# Patient Record
Sex: Male | Born: 1985 | Race: White | Hispanic: No | Marital: Married | Smoking: Current every day smoker
Health system: Southern US, Community
[De-identification: ages and names within clinical notes are randomized; demographics above are authoritative.]

## PROBLEM LIST (undated history)

## (undated) ENCOUNTER — Emergency Department: Payer: Medicaid Other

## (undated) DIAGNOSIS — N200 Calculus of kidney: Secondary | ICD-10-CM

## (undated) DIAGNOSIS — A770 Spotted fever due to Rickettsia rickettsii: Secondary | ICD-10-CM

## (undated) DIAGNOSIS — A0472 Enterocolitis due to Clostridium difficile, not specified as recurrent: Secondary | ICD-10-CM

## (undated) DIAGNOSIS — B1920 Unspecified viral hepatitis C without hepatic coma: Secondary | ICD-10-CM

## (undated) HISTORY — PX: TONSILLECTOMY: SUR1361

---

## 2005-04-19 ENCOUNTER — Emergency Department: Payer: Self-pay | Admitting: Unknown Physician Specialty

## 2008-02-26 ENCOUNTER — Ambulatory Visit: Payer: Self-pay | Admitting: Family Medicine

## 2008-02-26 ENCOUNTER — Emergency Department: Payer: Self-pay | Admitting: Emergency Medicine

## 2008-02-26 IMAGING — CR DG CHEST 2V
1 series · 2 of 2 positions shown · non-contrast
Comparison: none

REASON FOR EXAM: fever
COMMENTS:

[Series 1: view not recorded · 0.17mm/px · 2 of 2 slices shown]
[im 1/2]
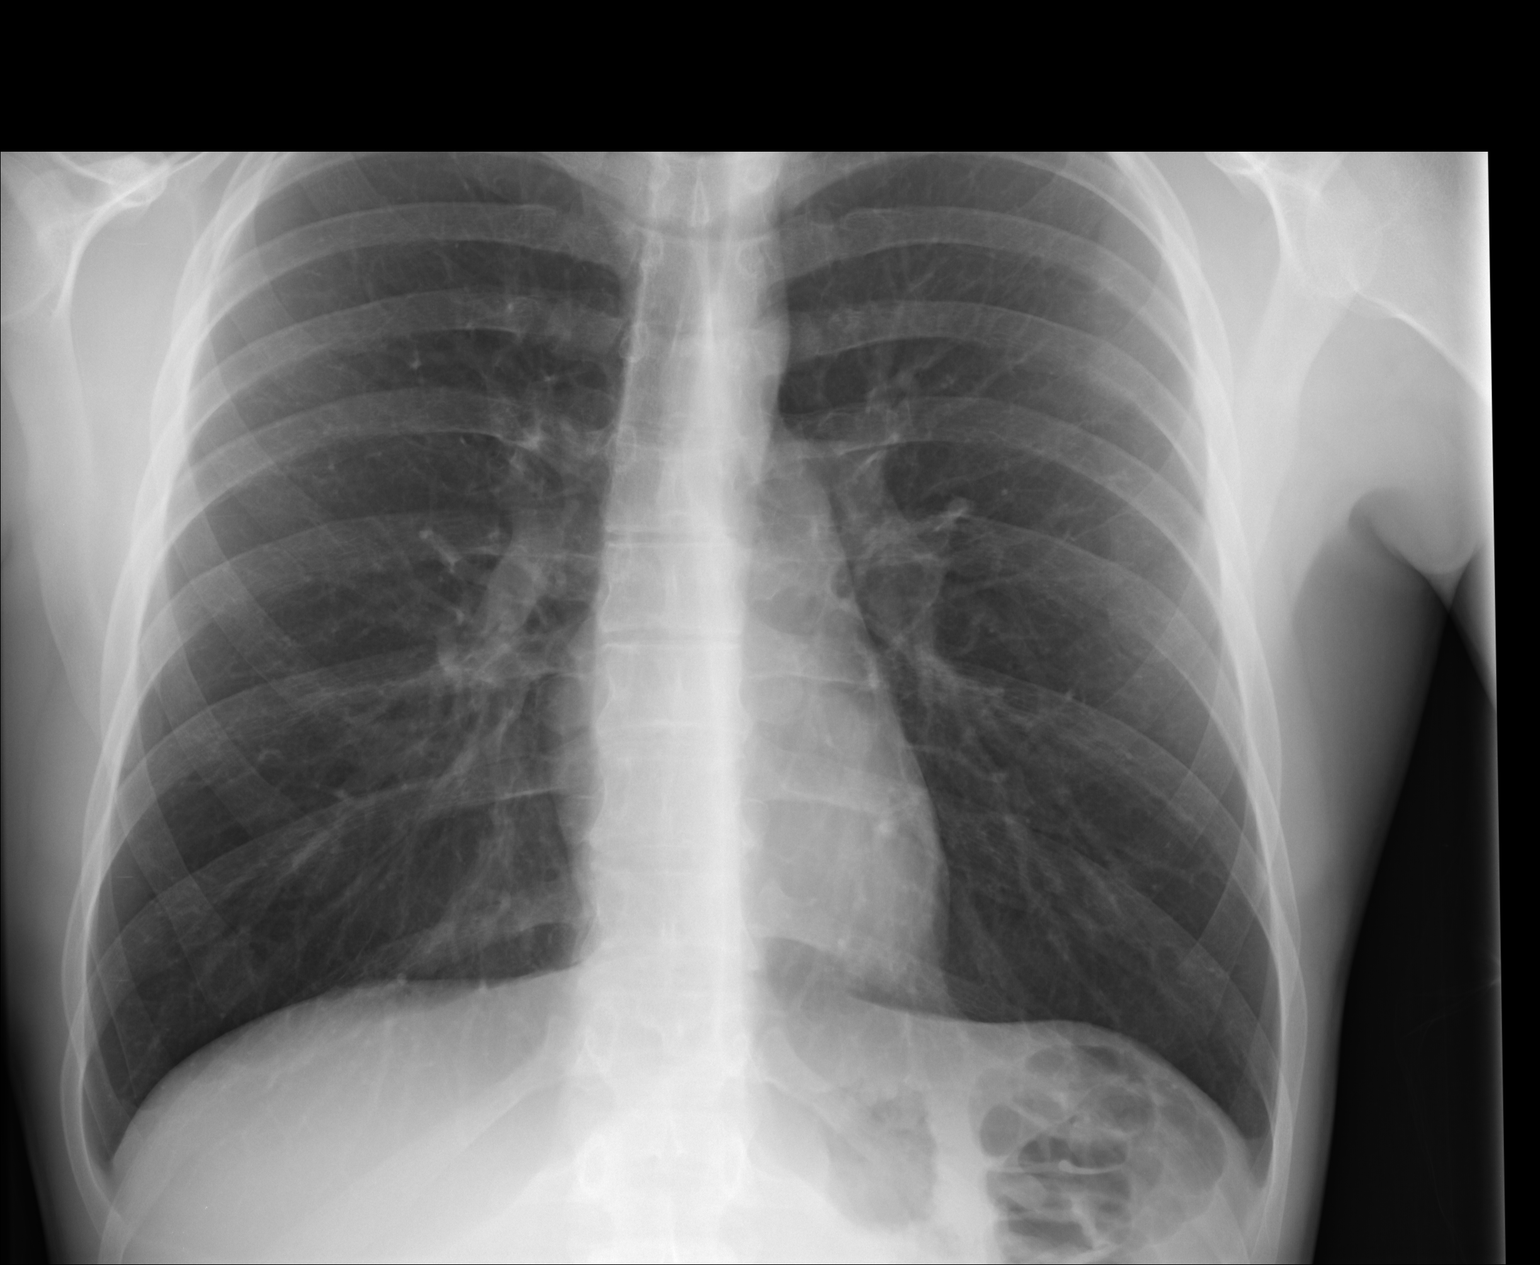
[im 2/2]
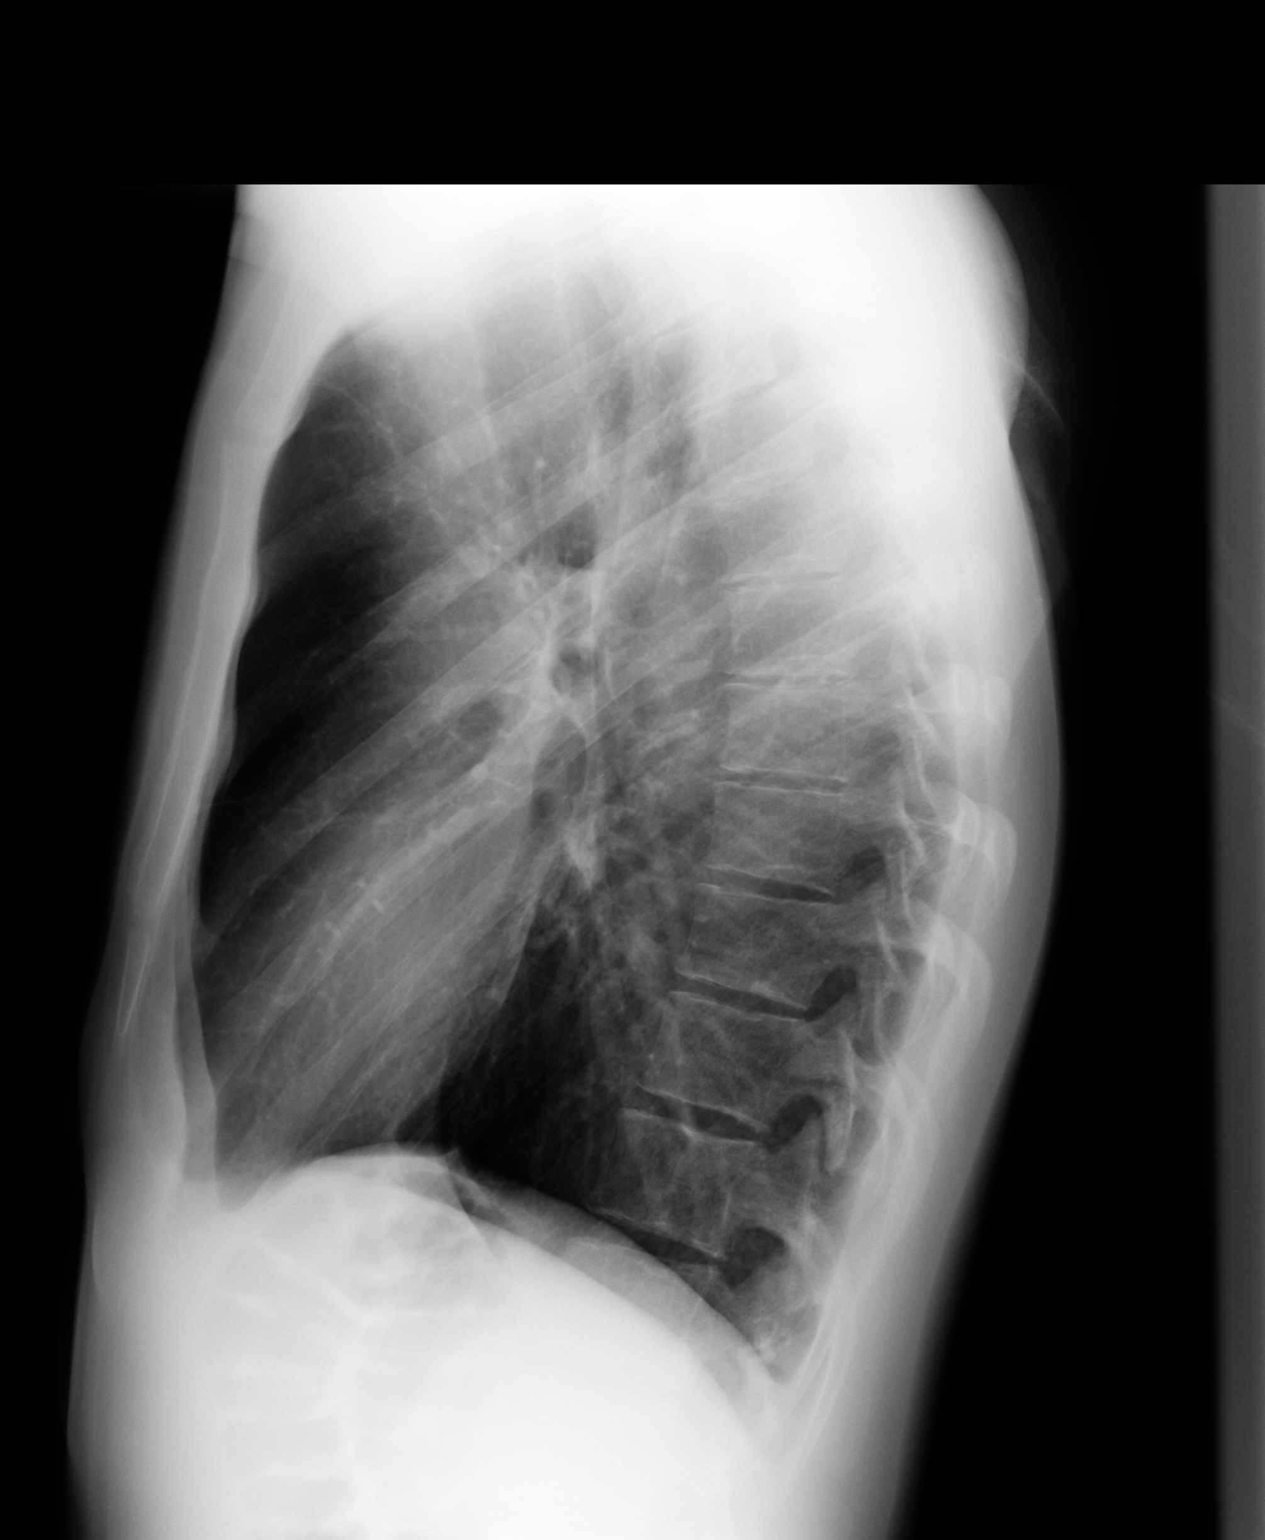

[2 of 2 positions shown; findings below may reference images not displayed]

PROCEDURE:     DXR - DXR CHEST PA (OR AP) AND LATERAL  - February 27, 2008 [DATE]

RESULT:     The lung fields are clear.  No pneumonia, pneumothorax or
pleural effusion is seen. Heart size is normal. The chest appears mildly
hyperexpanded.  The mediastinal and osseous structures are normal in
appearance.
IMPRESSION: 1.  The lung fields are clear.
2.  The chest appears mildly hyperexpanded.

## 2010-05-09 ENCOUNTER — Emergency Department (HOSPITAL_COMMUNITY): Admission: EM | Admit: 2010-05-09 | Discharge: 2010-05-09 | Payer: Self-pay | Admitting: Internal Medicine

## 2010-05-09 IMAGING — CT CT ABD-PELV W/O CM
2 of 4 series · 17 of 46 positions shown, 19 images · non-contrast
Comparison: None

CLINICAL DATA: Right-sided flank pain.  Hematuria.  History of
right renal stone.

CT ABDOMEN AND PELVIS WITHOUT CONTRAST
TECHNIQUE: Multidetector CT imaging of the abdomen and pelvis was
performed following the standard protocol without intravenous
contrast.

[Series 2: stone <(id) >(id) · axial · 0.70mm/px · z∈[-445,-65]mm · 14 of 84 slices shown, 16 images]
[im 4/84  soft-tissue]
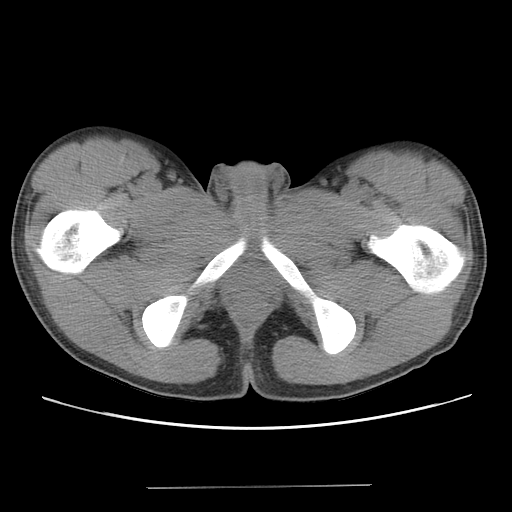
[im 4/84  bone]
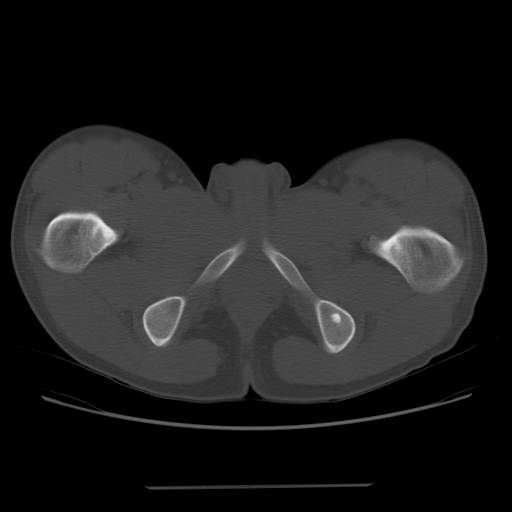
[im 12/84  soft-tissue]
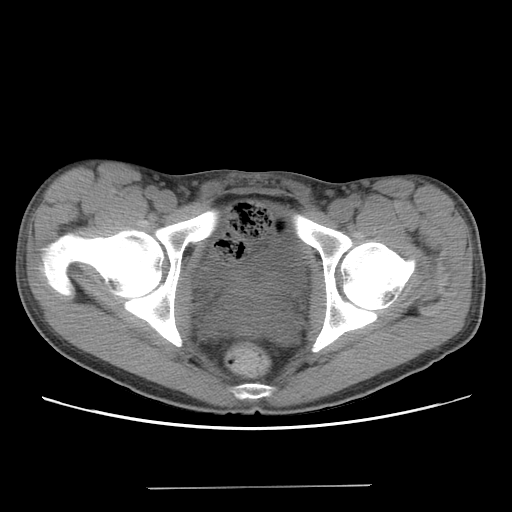
[im 16/84  soft-tissue]
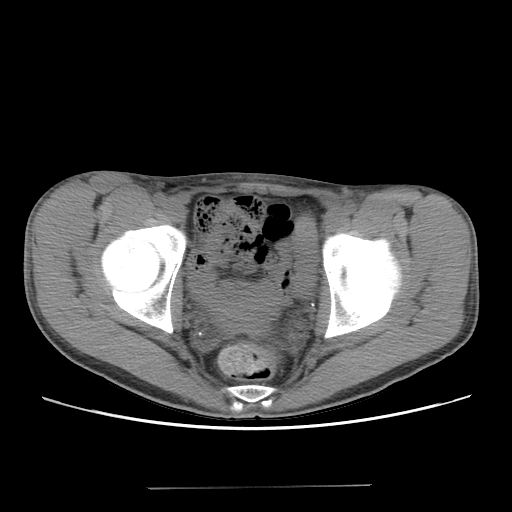
[im 23/84  soft-tissue]
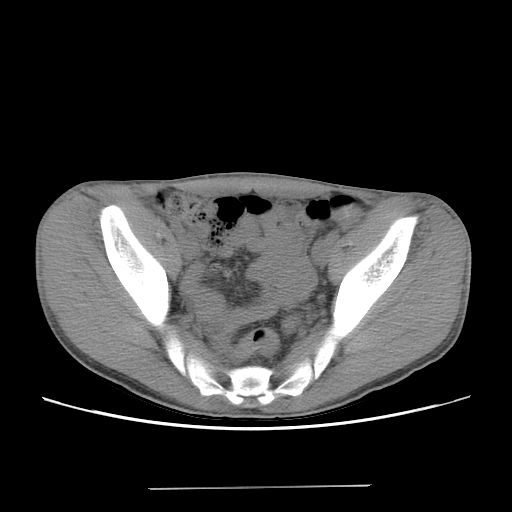
[im 27/84  soft-tissue]
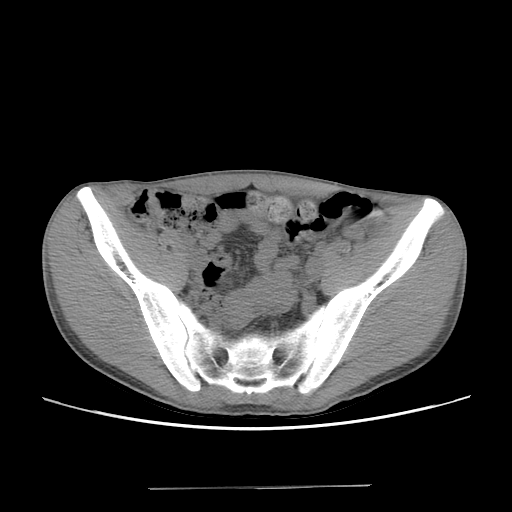
[im 34/84  soft-tissue]
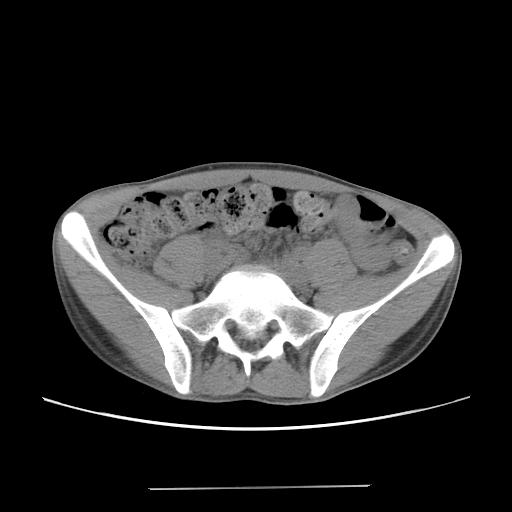
[im 38/84  soft-tissue]
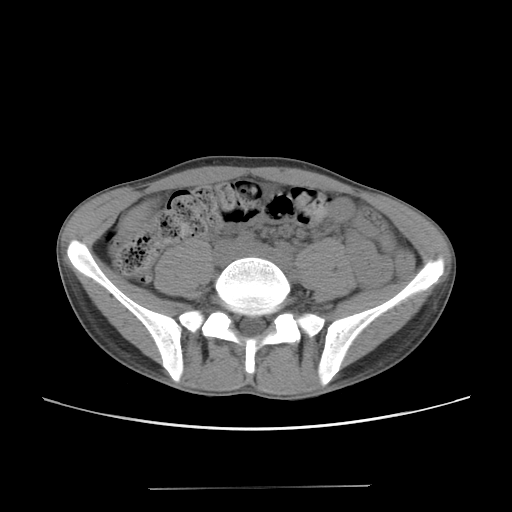
[im 46/84  soft-tissue]
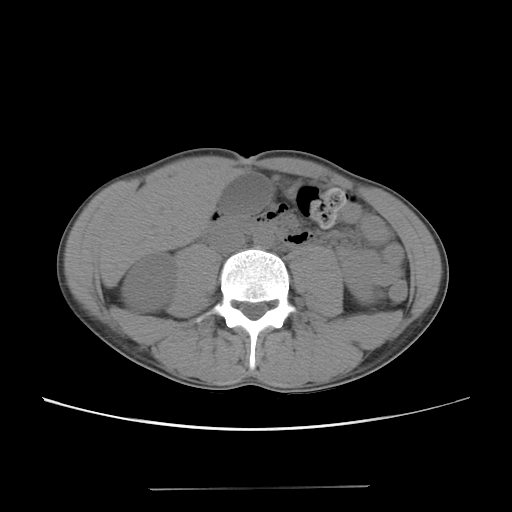
[im 50/84  soft-tissue]
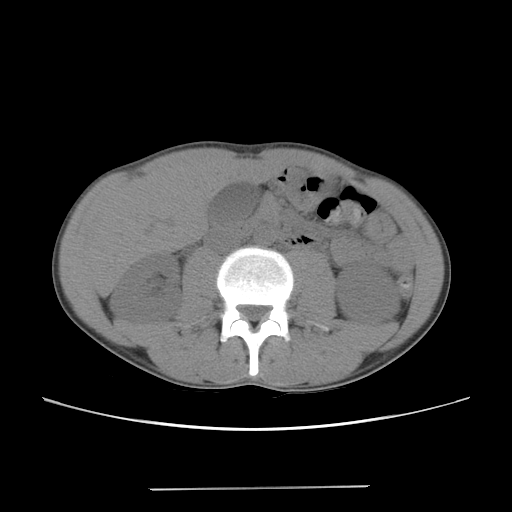
[im 50/84  bone]
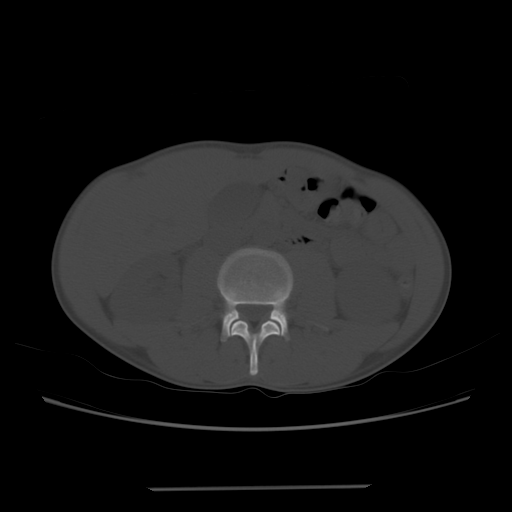
[im 57/84  soft-tissue]
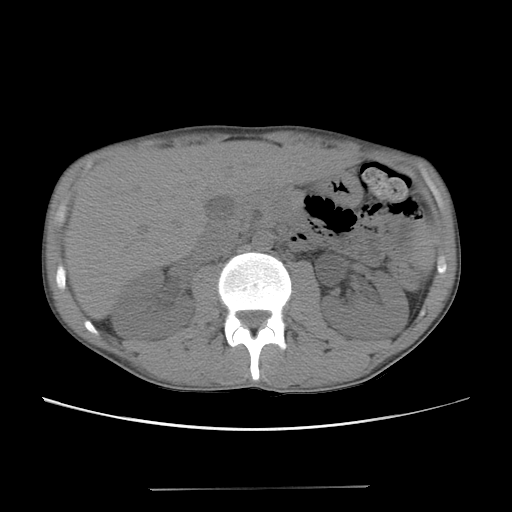
[im 61/84  soft-tissue]
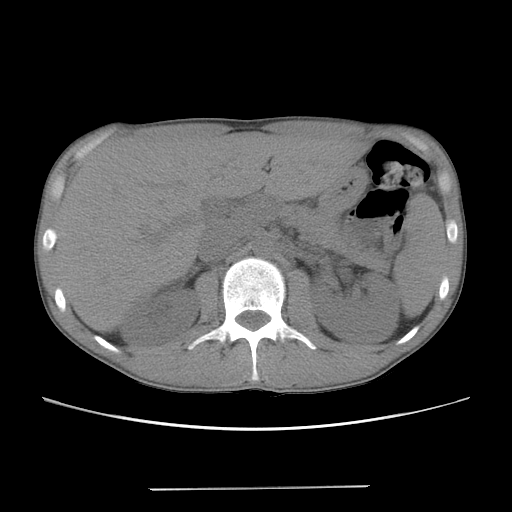
[im 68/84  soft-tissue]
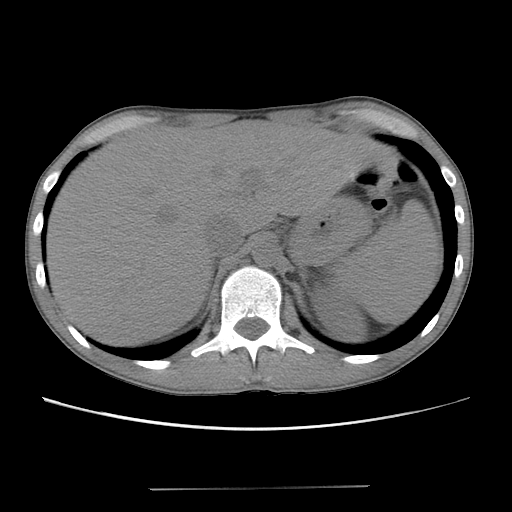
[im 72/84  soft-tissue]
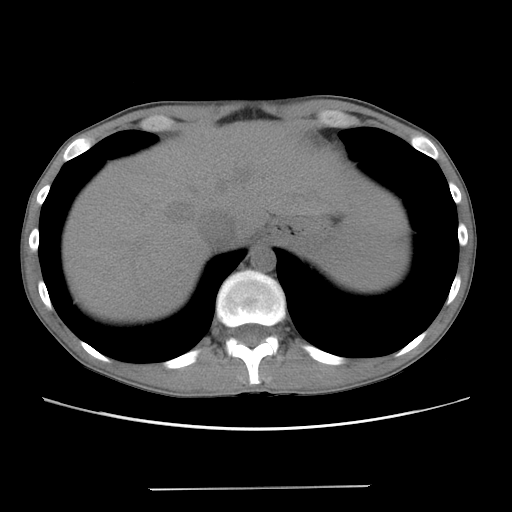
[im 80/84  soft-tissue]
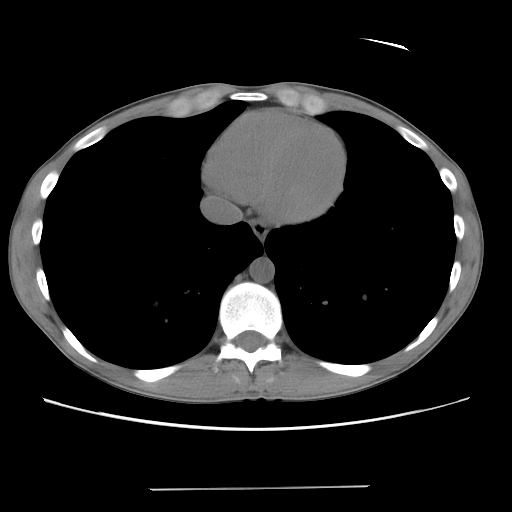

[Series 401: coronal · coronal · 0.87mm/px · 3 of 75 slices shown]
[im 25/75  soft-tissue]
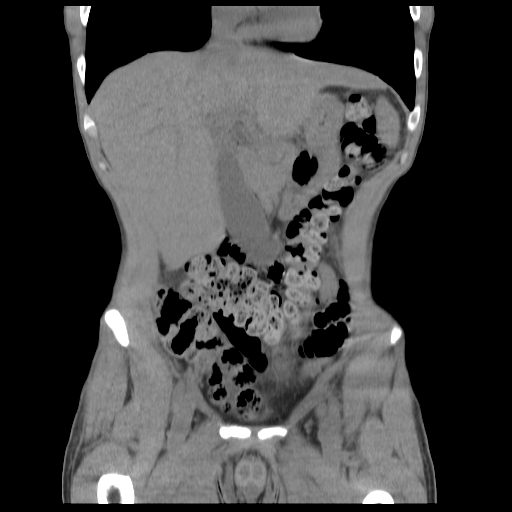
[im 33/75  soft-tissue]
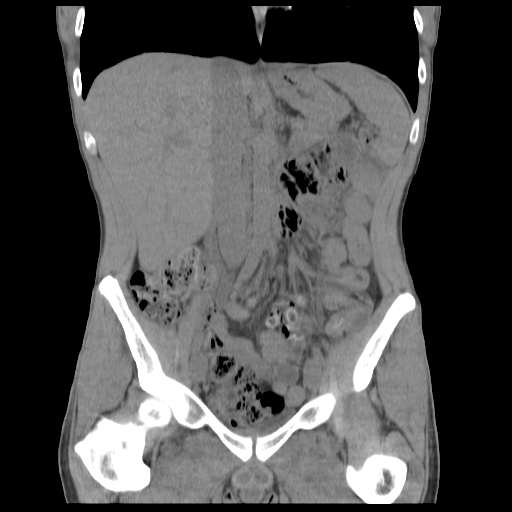
[im 42/75  soft-tissue]
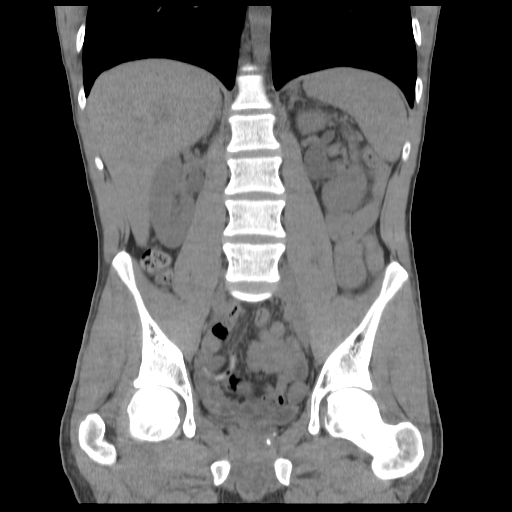

[17 of 46 positions shown; findings below may reference images not displayed]

FINDINGS: Lung bases are clear.  No pleural or pericardial fluid.
The liver has a normal appearance without contrast.  No calcified
gallstones.  The spleen is normal.  The pancreas is normal.  The
adrenal glands are normal.  The left kidney contains a 2 mm stone
in the midportion.  There is an extrarenal pelvis but no evidence
of dilated ureter or passing stone on the left.  There is some
phleboliths in the left pelvis.

On the right, there are a few 1-2 mm nonobstructing stones within
the kidney.  There is mild fullness of the renal collecting system
and ureter.  There is a 2 mm stone that has apparently passed into
the bladder and is lying in the midline.  Prostate gland and
seminal vesicles appear unremarkable.  No free fluid in the pelvis.

No bowel pathology is seen.
IMPRESSION: Several 1-2 mm nonobstructing calculi in the kidneys.  Mild
fullness of the right renal collecting system but no evidence of
stone in the ureter.  2 mm stone dependent within the bladder,
presumably having passed through the right ureteral system.

## 2010-05-12 ENCOUNTER — Emergency Department (HOSPITAL_COMMUNITY): Admission: EM | Admit: 2010-05-12 | Discharge: 2010-05-12 | Payer: Self-pay | Admitting: Emergency Medicine

## 2011-01-06 LAB — URINALYSIS, ROUTINE W REFLEX MICROSCOPIC
Protein, ur: 30 mg/dL — AB
Urobilinogen, UA: 1 mg/dL (ref 0.0–1.0)

## 2011-01-06 LAB — DIFFERENTIAL
Basophils Absolute: 0 10*3/uL (ref 0.0–0.1)
Basophils Relative: 0 % (ref 0–1)
Eosinophils Absolute: 0.2 10*3/uL (ref 0.0–0.7)
Eosinophils Relative: 2 % (ref 0–5)
Lymphocytes Relative: 25 % (ref 12–46)
Lymphs Abs: 2 10*3/uL (ref 0.7–4.0)
Monocytes Absolute: 0.6 10*3/uL (ref 0.1–1.0)
Neutro Abs: 5.2 10*3/uL (ref 1.7–7.7)
Neutrophils Relative %: 65 % (ref 43–77)

## 2011-01-06 LAB — CBC
Platelets: 224 10*3/uL (ref 150–400)
RBC: 4.19 MIL/uL — ABNORMAL LOW (ref 4.22–5.81)
RDW: 13.3 % (ref 11.5–15.5)

## 2011-01-06 LAB — URINE MICROSCOPIC-ADD ON

## 2011-01-06 LAB — URINE CULTURE: Colony Count: NO GROWTH

## 2011-01-06 LAB — POCT I-STAT, CHEM 8
BUN: 10 mg/dL (ref 6–23)
Hemoglobin: 13.3 g/dL (ref 13.0–17.0)
TCO2: 28 mmol/L (ref 0–100)

## 2011-05-30 ENCOUNTER — Emergency Department: Payer: Self-pay | Admitting: Emergency Medicine

## 2012-07-29 ENCOUNTER — Emergency Department: Payer: Self-pay | Admitting: Emergency Medicine

## 2012-07-29 LAB — ETHANOL: Ethanol: <3 mg/dL

## 2012-07-29 LAB — DRUG SCREEN, URINE
Amphetamines, Ur Screen: NEGATIVE
Barbiturates, Ur Screen: NEGATIVE
Benzodiazepine, Ur Scrn: POSITIVE
Cannabinoid 50 Ng, Ur ~~LOC~~: POSITIVE
Cocaine Metabolite,Ur ~~LOC~~: NEGATIVE
MDMA (Ecstasy)Ur Screen: NEGATIVE
Methadone, Ur Screen: NEGATIVE
Opiate, Ur Screen: POSITIVE
Phencyclidine (PCP) Ur S: NEGATIVE
Tricyclic, Ur Screen: NEGATIVE

## 2012-07-29 LAB — COMPREHENSIVE METABOLIC PANEL
Anion Gap: 6 — ABNORMAL LOW (ref 7–16)
BUN: 8 mg/dL (ref 7–18)
Bilirubin,Total: 0.3 mg/dL (ref 0.2–1.0)
Chloride: 106 mmol/L (ref 98–107)
Co2: 29 mmol/L (ref 21–32)
Creatinine: 0.87 mg/dL (ref 0.60–1.30)
EGFR (African American): >60
EGFR (Non-African Amer.): >60
Osmolality: 279 (ref 275–301)
Potassium: 4.5 mmol/L (ref 3.5–5.1)
SGPT (ALT): 16 U/L (ref 12–78)
Sodium: 141 mmol/L (ref 136–145)
Total Protein: 7.8 g/dL (ref 6.4–8.2)

## 2012-07-29 LAB — CBC
HGB: 14.6 g/dL (ref 13.0–18.0)
MCHC: 34.7 g/dL (ref 32.0–36.0)
RBC: 4.83 10*6/uL (ref 4.40–5.90)

## 2012-07-29 LAB — TSH: Thyroid Stimulating Horm: 2.21 u[IU]/mL

## 2012-12-19 ENCOUNTER — Emergency Department (HOSPITAL_COMMUNITY): Payer: Self-pay

## 2012-12-19 ENCOUNTER — Other Ambulatory Visit: Payer: Self-pay

## 2012-12-19 ENCOUNTER — Inpatient Hospital Stay (HOSPITAL_COMMUNITY)
Admission: EM | Admit: 2012-12-19 | Discharge: 2012-12-22 | DRG: 917 | Disposition: A | Discharge status: Home / Self Care | Payer: Self-pay | Attending: Internal Medicine | Admitting: Internal Medicine

## 2012-12-19 DIAGNOSIS — G929 Unspecified toxic encephalopathy: Secondary | ICD-10-CM | POA: Diagnosis present

## 2012-12-19 DIAGNOSIS — I498 Other specified cardiac arrhythmias: Secondary | ICD-10-CM | POA: Diagnosis present

## 2012-12-19 DIAGNOSIS — I959 Hypotension, unspecified: Secondary | ICD-10-CM | POA: Diagnosis present

## 2012-12-19 DIAGNOSIS — E872 Acidosis, unspecified: Secondary | ICD-10-CM | POA: Diagnosis present

## 2012-12-19 DIAGNOSIS — G92 Toxic encephalopathy: Secondary | ICD-10-CM | POA: Diagnosis present

## 2012-12-19 DIAGNOSIS — R7309 Other abnormal glucose: Secondary | ICD-10-CM | POA: Diagnosis present

## 2012-12-19 DIAGNOSIS — R945 Abnormal results of liver function studies: Secondary | ICD-10-CM | POA: Diagnosis present

## 2012-12-19 DIAGNOSIS — F1410 Cocaine abuse, uncomplicated: Secondary | ICD-10-CM | POA: Diagnosis present

## 2012-12-19 DIAGNOSIS — F1110 Opioid abuse, uncomplicated: Secondary | ICD-10-CM | POA: Diagnosis present

## 2012-12-19 DIAGNOSIS — F1310 Sedative, hypnotic or anxiolytic abuse, uncomplicated: Secondary | ICD-10-CM | POA: Diagnosis present

## 2012-12-19 DIAGNOSIS — J9600 Acute respiratory failure, unspecified whether with hypoxia or hypercapnia: Secondary | ICD-10-CM | POA: Diagnosis present

## 2012-12-19 DIAGNOSIS — T43601A Poisoning by unspecified psychostimulants, accidental (unintentional), initial encounter: Secondary | ICD-10-CM | POA: Diagnosis present

## 2012-12-19 DIAGNOSIS — T405X1A Poisoning by cocaine, accidental (unintentional), initial encounter: Secondary | ICD-10-CM | POA: Diagnosis present

## 2012-12-19 DIAGNOSIS — F1910 Other psychoactive substance abuse, uncomplicated: Secondary | ICD-10-CM

## 2012-12-19 DIAGNOSIS — T424X4A Poisoning by benzodiazepines, undetermined, initial encounter: Secondary | ICD-10-CM | POA: Diagnosis present

## 2012-12-19 DIAGNOSIS — J9601 Acute respiratory failure with hypoxia: Secondary | ICD-10-CM | POA: Diagnosis present

## 2012-12-19 DIAGNOSIS — J9690 Respiratory failure, unspecified, unspecified whether with hypoxia or hypercapnia: Secondary | ICD-10-CM

## 2012-12-19 DIAGNOSIS — T50901A Poisoning by unspecified drugs, medicaments and biological substances, accidental (unintentional), initial encounter: Secondary | ICD-10-CM

## 2012-12-19 DIAGNOSIS — J9819 Other pulmonary collapse: Secondary | ICD-10-CM | POA: Diagnosis present

## 2012-12-19 DIAGNOSIS — T401X4A Poisoning by heroin, undetermined, initial encounter: Principal | ICD-10-CM | POA: Diagnosis present

## 2012-12-19 DIAGNOSIS — J690 Pneumonitis due to inhalation of food and vomit: Secondary | ICD-10-CM | POA: Diagnosis present

## 2012-12-19 DIAGNOSIS — T424X1A Poisoning by benzodiazepines, accidental (unintentional), initial encounter: Secondary | ICD-10-CM | POA: Diagnosis present

## 2012-12-19 DIAGNOSIS — T50911A Poisoning by multiple unspecified drugs, medicaments and biological substances, accidental (unintentional), initial encounter: Secondary | ICD-10-CM

## 2012-12-19 DIAGNOSIS — T401X1A Poisoning by heroin, accidental (unintentional), initial encounter: Secondary | ICD-10-CM | POA: Diagnosis present

## 2012-12-19 DIAGNOSIS — R739 Hyperglycemia, unspecified: Secondary | ICD-10-CM

## 2012-12-19 DIAGNOSIS — F17200 Nicotine dependence, unspecified, uncomplicated: Secondary | ICD-10-CM | POA: Diagnosis present

## 2012-12-19 DIAGNOSIS — Y9229 Other specified public building as the place of occurrence of the external cause: Secondary | ICD-10-CM

## 2012-12-19 DIAGNOSIS — E876 Hypokalemia: Secondary | ICD-10-CM | POA: Diagnosis not present

## 2012-12-19 DIAGNOSIS — R4189 Other symptoms and signs involving cognitive functions and awareness: Secondary | ICD-10-CM

## 2012-12-19 LAB — CBC WITH DIFFERENTIAL/PLATELET
HCT: 39.3 % (ref 39.0–52.0)
Lymphocytes Relative: 59 % — ABNORMAL HIGH (ref 12–46)
Lymphs Abs: 3.8 10*3/uL (ref 0.7–4.0)
Neutro Abs: 2 10*3/uL (ref 1.7–7.7)
Platelets: 193 10*3/uL (ref 150–400)
RBC: 4.33 MIL/uL (ref 4.22–5.81)
RDW: 13.6 % (ref 11.5–15.5)
WBC: 6.4 10*3/uL (ref 4.0–10.5)

## 2012-12-19 LAB — URINALYSIS, ROUTINE W REFLEX MICROSCOPIC
Glucose, UA: >1000 mg/dL — AB
Leukocytes, UA: NEGATIVE
Nitrite: NEGATIVE
Specific Gravity, Urine: 1.02 (ref 1.005–1.030)
pH: 5 (ref 5.0–8.0)

## 2012-12-19 LAB — GLUCOSE, CAPILLARY: Glucose-Capillary: 314 mg/dL — ABNORMAL HIGH (ref 70–99)

## 2012-12-19 LAB — RAPID URINE DRUG SCREEN, HOSP PERFORMED
Amphetamines: NOT DETECTED
Benzodiazepines: POSITIVE — AB
Opiates: POSITIVE — AB

## 2012-12-19 LAB — POCT I-STAT 3, ART BLOOD GAS (G3+)
Acid-Base Excess: 1 mmol/L (ref 0.0–2.0)
Bicarbonate: 28.4 mEq/L — ABNORMAL HIGH (ref 20.0–24.0)
Patient temperature: 98.6
pH, Arterial: 7.295 — ABNORMAL LOW (ref 7.350–7.450)
pO2, Arterial: 321 mmHg — ABNORMAL HIGH (ref 80.0–100.0)

## 2012-12-19 LAB — COMPREHENSIVE METABOLIC PANEL
ALT: 84 U/L — ABNORMAL HIGH (ref 0–53)
AST: 92 U/L — ABNORMAL HIGH (ref 0–37)
Albumin: 3.8 g/dL (ref 3.5–5.2)
Albumin: 3.7 g/dL (ref 3.5–5.2)
Alkaline Phosphatase: 81 U/L (ref 39–117)
BUN: 15 mg/dL (ref 6–23)
Calcium: 9.1 mg/dL (ref 8.4–10.5)
Chloride: 104 mEq/L (ref 96–112)
Creatinine, Ser: 1.03 mg/dL (ref 0.50–1.35)
GFR calc Af Amer: >90 mL/min (ref >90)
Potassium: 4.2 mEq/L (ref 3.5–5.1)
Potassium: 4 mEq/L (ref 3.5–5.1)
Sodium: 140 mEq/L (ref 135–145)
Total Bilirubin: 0.5 mg/dL (ref 0.3–1.2)
Total Protein: 7.1 g/dL (ref 6.0–8.3)
Total Protein: 6.7 g/dL (ref 6.0–8.3)

## 2012-12-19 LAB — URINE MICROSCOPIC-ADD ON

## 2012-12-19 LAB — LACTIC ACID, PLASMA: Lactic Acid, Venous: 6.3 mmol/L — ABNORMAL HIGH (ref 0.5–2.2)

## 2012-12-19 MED ORDER — ACETAMINOPHEN 160 MG/5ML PO SOLN
650.0000 mg | Freq: Four times a day (QID) | ORAL | Status: DC | PRN
  Administered 2012-12-19 – 2012-12-21 (×3): 650 mg
  Filled 2012-12-19 (×4): qty 20.3

## 2012-12-19 MED ORDER — NALOXONE HCL 0.4 MG/ML IJ SOLN
0.4000 mg | Freq: Once | INTRAMUSCULAR | Status: AC
  Administered 2012-12-19: 0.4 mg via INTRAVENOUS

## 2012-12-19 MED ORDER — INSULIN ASPART 100 UNIT/ML ~~LOC~~ SOLN
1.0000 [IU] | SUBCUTANEOUS | Status: DC
  Administered 2012-12-20: 1 [IU] via SUBCUTANEOUS
  Administered 2012-12-20: 2 [IU] via SUBCUTANEOUS
  Administered 2012-12-21: 1 [IU] via SUBCUTANEOUS

## 2012-12-19 MED ORDER — ETOMIDATE 2 MG/ML IV SOLN
INTRAVENOUS | Status: AC
  Filled 2012-12-19: qty 20

## 2012-12-19 MED ORDER — NALOXONE HCL 0.4 MG/ML IJ SOLN
INTRAMUSCULAR | Status: AC
  Filled 2012-12-19: qty 1

## 2012-12-19 MED ORDER — PROPOFOL 10 MG/ML IV EMUL
INTRAVENOUS | Status: AC
  Administered 2012-12-19: 10 ug/kg/min via INTRAVENOUS
  Filled 2012-12-19: qty 100

## 2012-12-19 MED ORDER — LIDOCAINE HCL (CARDIAC) 20 MG/ML IV SOLN
INTRAVENOUS | Status: AC
  Filled 2012-12-19: qty 5

## 2012-12-19 MED ORDER — SUCCINYLCHOLINE CHLORIDE 20 MG/ML IJ SOLN
INTRAMUSCULAR | Status: AC | PRN
  Administered 2012-12-19: 150 mg via INTRAVENOUS

## 2012-12-19 MED ORDER — SUCCINYLCHOLINE CHLORIDE 20 MG/ML IJ SOLN
INTRAMUSCULAR | Status: AC
  Filled 2012-12-19: qty 1

## 2012-12-19 MED ORDER — SODIUM CHLORIDE 0.9 % IV BOLUS (SEPSIS)
500.0000 mL | Freq: Once | INTRAVENOUS | Status: AC
  Administered 2012-12-19: 500 mL via INTRAVENOUS

## 2012-12-19 MED ORDER — MIDAZOLAM HCL 2 MG/2ML IJ SOLN
4.0000 mg | Freq: Once | INTRAMUSCULAR | Status: AC
  Administered 2012-12-19: 4 mg via INTRAVENOUS

## 2012-12-19 MED ORDER — SODIUM CHLORIDE 0.9 % IV SOLN: 250.0000 mL | INTRAVENOUS | Status: DC | PRN

## 2012-12-19 MED ORDER — PANTOPRAZOLE SODIUM 40 MG IV SOLR
40.0000 mg | INTRAVENOUS | Status: DC
  Administered 2012-12-19: 40 mg via INTRAVENOUS
  Filled 2012-12-19 (×2): qty 40

## 2012-12-19 MED ORDER — DEXMEDETOMIDINE HCL IN NACL 200 MCG/50ML IV SOLN
0.4000 ug/kg/h | INTRAVENOUS | Status: DC
  Administered 2012-12-19 – 2012-12-20 (×2): 0.4 ug/kg/h via INTRAVENOUS
  Filled 2012-12-19 (×2): qty 50

## 2012-12-19 MED ORDER — MIDAZOLAM HCL 2 MG/2ML IJ SOLN
INTRAMUSCULAR | Status: AC
  Filled 2012-12-19: qty 4

## 2012-12-19 MED ORDER — ROCURONIUM BROMIDE 50 MG/5ML IV SOLN
INTRAVENOUS | Status: AC
  Filled 2012-12-19: qty 2

## 2012-12-19 MED ORDER — SODIUM CHLORIDE 0.9 % IV SOLN
INTRAVENOUS | Status: DC
  Administered 2012-12-19 – 2012-12-22 (×6): via INTRAVENOUS

## 2012-12-19 MED ORDER — ETOMIDATE 2 MG/ML IV SOLN
INTRAVENOUS | Status: AC | PRN
  Administered 2012-12-19: 20 mg via INTRAVENOUS

## 2012-12-19 MED ORDER — ENOXAPARIN SODIUM 40 MG/0.4ML ~~LOC~~ SOLN
40.0000 mg | SUBCUTANEOUS | Status: DC
  Administered 2012-12-20 – 2012-12-22 (×3): 40 mg via SUBCUTANEOUS
  Filled 2012-12-19 (×4): qty 0.4

## 2012-12-19 MED ORDER — PROPOFOL 10 MG/ML IV EMUL
5.0000 ug/kg/min | Freq: Once | INTRAVENOUS | Status: DC
  Administered 2012-12-19 (×2): 10 ug/kg/min via INTRAVENOUS

## 2012-12-19 NOTE — ED Notes (Addendum)
Pt drug out of car, suspected heroin overdose. Oxygen sats in the 30's initially.

## 2012-12-19 NOTE — Progress Notes (Signed)
eLink Physician-Brief Progress Note Patient Name: Richard Logan DOB: [birth year] MRN: [id number]  Date of Service  12/19/2012   HPI/Events of Note  Temp of greater than 101F and hypotension with current BP of 88/46 (56).   eICU Interventions  Plan: Tylenol 650 mg via tube q6 hours prn temp greater than 100.67F NS 500 cc bolus for hypotension   Intervention Category Intermediate Interventions: Hypotension - evaluation and management Minor Interventions: Routine modifications to care plan (e.g. PRN medications for pain, fever)  Mandy Peeks 12/19/2012, 11:21 PM

## 2012-12-19 NOTE — ED Notes (Addendum)
PCCM currently at [facility], family x2 at [facility] No changes, VSS. pending admission orders.

## 2012-12-19 NOTE — ED Notes (Signed)
RT at [facility], vent setting changes made, VSS, pt calm, NAD, breathing with vent, propofol infusing, intermitant movement of R hand, 3rd IV site established. PCCM admitting MD into room.

## 2012-12-19 NOTE — Progress Notes (Signed)
ETT advanced to 26cm at the lip per MD order. No complications noted. RT will monitor.

## 2012-12-19 NOTE — ED Notes (Addendum)
No changes, report called to Delsa Sale, RN primary receiving RN [phone]

## 2012-12-19 NOTE — ED Notes (Signed)
PCCM Dr. Delma Post at [facility], soft wrist restraints applied per v.o., pt arousable to exam, reaching for ETT.

## 2012-12-19 NOTE — ED Notes (Addendum)
Pt belongings: gray ring, beaded necklace, brown wallet with $16.00. Jeans, sneakers, blue boxers.

## 2012-12-19 NOTE — ED Notes (Signed)
2nd 4mg  bolus of propofol given enroute to 2100 for restlessness. Pt raising head, arms and legs. Rate increased to 40mcg/kg/min (from ).

## 2012-12-19 NOTE — ED Provider Notes (Signed)
History     CSN: [id number]  Arrival date & time 12/19/12  1706   First MD Initiated Contact with Patient 12/19/12 1715      Chief Complaint  Patient presents with  . Drug Overdose    Patient is a 27 y.o. male presenting with Overdose.  Drug Overdose   Richard Logan is a 27 y.o. male who presents to the ED unresponsive.  Little is known about him.  Went through waiting room.    No past medical history on file.  No past surgical history on file.  Family History: Reviewed.  None pertinent.    History  Substance Use Topics  . Smoking status: Not on file  . Smokeless tobacco: Not on file  . Alcohol Use: Not on file      Review of Systems  Unable to perform ROS: Patient unresponsive  Psychiatric/Behavioral: Negative.     Allergies  Review of patient's allergies indicates not on file.  Home Medications  No current outpatient prescriptions on file.  BP 118/76  SpO2 98%  Physical Exam  Constitutional: He appears toxic. He appears distressed.  HENT:  Head: Normocephalic and atraumatic.  Right Ear: External ear normal.  Left Ear: External ear normal.  Eyes: Pupils are equal, round, and reactive to light.  Neck: Normal range of motion. Neck supple. No tracheal deviation present.  Cardiovascular: Normal rate, regular rhythm and normal heart sounds.   Pulmonary/Chest:  Snoring respirations  Abdominal: Soft. There is no tenderness.  Genitourinary: Penis normal.  Musculoskeletal: Normal range of motion.  Neurological: He is unresponsive. GCS eye subscore is 1. GCS verbal subscore is 1. GCS motor subscore is 1.  Skin: Skin is warm and dry.    ED Course  INTUBATION Date/Time: 12/20/2012 12:41 AM Performed by: Arloa Koh Authorized by: Arloa Koh Consent: The procedure was performed in an emergent situation. Indications: airway protection, respiratory failure and hypoxemia Intubation method: direct Patient status: paralyzed (RSI) Preoxygenation:  BVM Sedatives: etomidate Paralytic: succinylcholine Laryngoscope size: Miller 4 Tube size: 7.5 mm Tube type: cuffed Number of attempts: 2 Ventilation between attempts: BVM Cricoid pressure: yes Cords visualized: yes Post-procedure assessment: ETCO2 monitor Breath sounds: equal Cuff inflated: yes Tube secured with: ETT holder Chest x-ray interpreted by me. Chest x-ray findings: endotracheal tube too high Tube repositioned: tube repositioned successfully Comments: Attempted with mac 3 but unsuccessful.  Intubated with ease using mac 4.  Vomit all over airway prior to initiating intubation.  Suctioned heavily during procedure.   (including critical care time)  Labs Reviewed  CBC WITH DIFFERENTIAL - Abnormal; Notable for the following:    Neutrophils Relative 31 (*)    Lymphocytes Relative 59 (*)    All other components within normal limits  COMPREHENSIVE METABOLIC PANEL - Abnormal; Notable for the following:    Glucose, Bld 302 (*)    AST 112 (*)    ALT 95 (*)    Total Bilirubin 0.2 (*)    All other components within normal limits  URINALYSIS, ROUTINE W REFLEX MICROSCOPIC - Abnormal; Notable for the following:    Glucose, UA >1000 (*)    All other components within normal limits  URINE RAPID DRUG SCREEN (HOSP PERFORMED) - Abnormal; Notable for the following:    Opiates POSITIVE (*)    Cocaine POSITIVE (*)    Benzodiazepines POSITIVE (*)    All other components within normal limits  SALICYLATE LEVEL - Abnormal; Notable for the following:    Salicylate Lvl <2.0 (*)  All other components within normal limits  LACTIC ACID, PLASMA - Abnormal; Notable for the following:    Lactic Acid, Venous 6.3 (*)    All other components within normal limits  GLUCOSE, CAPILLARY - Abnormal; Notable for the following:    Glucose-Capillary 314 (*)    All other components within normal limits  URINE MICROSCOPIC-ADD ON - Abnormal; Notable for the following:    Bacteria, UA FEW (*)    Casts  HYALINE CASTS (*)    Crystals URIC ACID CRYSTALS (*)    All other components within normal limits  COMPREHENSIVE METABOLIC PANEL - Abnormal; Notable for the following:    Glucose, Bld 64 (*)    AST 92 (*)    ALT 84 (*)    All other components within normal limits  POCT I-STAT 3, BLOOD GAS (G3+) - Abnormal; Notable for the following:    pH, Arterial 7.295 (*)    pCO2 arterial 58.5 (*)    pO2, Arterial 321.0 (*)    Bicarbonate 28.4 (*)    All other components within normal limits  POCT I-STAT 3, BLOOD GAS (G3+) - Abnormal; Notable for the following:    pO2, Arterial 103.0 (*)    All other components within normal limits  MRSA PCR SCREENING  ETHANOL  ACETAMINOPHEN LEVEL  GLUCOSE, CAPILLARY  COMPREHENSIVE METABOLIC PANEL  MAGNESIUM  PHOSPHORUS  LACTIC ACID, PLASMA  CBC WITH DIFFERENTIAL  BLOOD GAS, ARTERIAL  CBC  BLOOD GAS, ARTERIAL  MAGNESIUM  LACTIC ACID, PLASMA  CK    Date: 12/19/2012  Rate: 102  Rhythm: sinus tachycardia  QRS Axis: normal  Intervals: normal  ST/T Wave abnormalities: normal  Conduction Disutrbances:none  Narrative Interpretation: Sinus tachycardia.  Otherwise normal.  Old EKG Reviewed: none available  Ct Head Wo Contrast  12/19/2012  *RADIOLOGY REPORT*  Clinical Data: 27 year old male drug overdose.  Intubated.  CT HEAD WITHOUT CONTRAST  Technique:  Contiguous axial images were obtained from the base of the skull through the vertex without contrast.  Comparison: None.  Findings: Nasal cavity and oropharynx opacified with secretions. Mild ethmoid and maxillary sinus mucosal thickening.  Other Visualized paranasal sinuses and mastoids are clear.  No acute osseous abnormality identified.  Visualized orbits and scalp soft tissues are within normal limits.  Cerebral volume is within normal limits for age.  No midline shift, ventriculomegaly, mass effect, evidence of mass lesion, intracranial hemorrhage or evidence of cortically based acute infarction.   Gray-white matter differentiation is within normal limits throughout the brain.  No suspicious intracranial vascular hyperdensity.  IMPRESSION: 1. Normal noncontrast CT appearance of the brain. 2.  Fluid in the nasal cavity and pharynx in the setting of intubation.   Original Report Authenticated By: Erskine Speed, M.D.    Dg Chest Portable 1 View  12/19/2012  *RADIOLOGY REPORT*  Clinical Data: 27 year old male.  Overdose, intubated.  PORTABLE CHEST - 1 VIEW  Comparison: None.  Findings: Portable AP supine view 1800 hours.  Endotracheal tube tip at the level of the clavicles.  Enteric tube in place, courses to the epigastric region, tip not included.  Decreased left lung volume with vague left perihilar, mostly infrahilar opacity.  No pneumothorax or pleural effusion evident on this supine view. Pulmonary vascularity within normal limits.  Cardiac size and mediastinal contours are within normal limits.  No acute osseous abnormality identified.  IMPRESSION: 1.  Endotracheal tube tip at the level of clavicles.  Enteric tube courses to the abdomen, tip not included. 2.  Decreased left lung volume and vague infrahilar opacity.  Favor atelectasis, but aspiration possible.  Asymmetric pulmonary edema not suspected at this time.   Original Report Authenticated By: Erskine Speed, M.D.      1. Unresponsive   2. Drug overdose, initial encounter   3. Polysubstance abuse   4. Respiratory failure   5. Hyperglycemia       MDM   27 year old male who presented to the emergency department unresponsive. Report from girlfriend was the patient had overdosed on heroin. Tract marks on arms according to diagnosis. Initial exam with cyanosis and GCS of 3. Back valve mask applied and initial oxygen saturation in midpoint. And able to ventilate patient with bag valve mask and improved saturations 100% but without improvement in mental status. 2 mg Narcan given with no response. Intubation undertook with ease. Critical-care  consulted for admission. Prior to critical-care arrival patient appeared to have what a seizure with posturing. Versed given IV and propofol started.  Tremors stopped. Later mother and grandmother appeared at bedside. Family updated about critical status the patient and possible poor prognosis. Patient admitted to the ICU in critical condition.       Arloa Koh, MD 12/20/12 [phone]

## 2012-12-19 NOTE — Progress Notes (Signed)
12/19/12 1900  Adult Ventilator Settings  Vent Type Servo i  Humidity HME  Vent Mode PRVC  Vt Set 590 mL  Set Rate 18 bmp  FiO2 (%) 50 %  I Time 0.8 Sec(s)  Increase RR to 18 and decrease Fio2 to 50% post ABG result.

## 2012-12-19 NOTE — H&P (Signed)
Name: Richard Logan MRN: [id number] DOB: 12/23/1985    LOS: 0  Referring Provider:  ED Reason for Referral:  Intubated for Respiratory failure due to multiple drug overdose  PULMONARY / CRITICAL CARE MEDICINE  HPI:  Patient unable to give any history, this is taken from conversation with other providers in the ED and with his mother + grandmother. Apparently a few hours ago he was brought in to this ED by his GF. He was last seen normal at home around [facility] and left to supposedly play putt-putt with his GF, he drank a little beer (less than 1 can) and took a shot of heroin on his arm around 4PM, then he went flaccid, passed out, becoming cyanotic, and vomited. Upon presentation in the ED around 20 minutes later, he was reported to be completely flaccid (GCS 3) and very cyanotic, with pulse ox reading in the 30's. Slightly hypothermic at 95.7, slightly tachy at 110 bpm, BP was stable. Apparently not protecting his airway and was intubated in the ED. It is not known how long he has been down. After intubation he was initially suspected to be "posturing" and biting his ET tube, was started on propofol drip and currently has periodic purposeful movement on all extremities, tube biting, and eye opening upon strong pain stimuli and ET suction. Have both mixed respiratory and metabolic acidosis, elevated lactate. CXR showed possible atelectasis. Has some mucoid secretion when suctioned via ET tube. Unknown PMH, unknown medication history. Urine positive for opioid, benzo, cocaine. EtOH level 11, acetaminophen level low. CT brain not remarkable Smokes cigarettes, not a heavy drinker as per family members.  No past medical history on file. No past surgical history on file. Prior to Admission medications   Not on File   Allergies Allergies not on file  Family History No family history on file. Social History  has no tobacco, alcohol, and drug history on file.  Review Of Systems:  Unable to provide  any due to unresponsiveness and inability to communicate at the moment  Brief patient description:  Young male with unknown medical history, brought in for multiple drug overdose, intubated for airway protection. Has mixed resp and metabolic acidosis (lactate). Possible aspiration but low suspicion of sepsis/PNA at the moment.  Vital Signs: Temp:  [95.7 F (35.4 C)-98.4 F (36.9 C)] 98.4 F (36.9 C) (02/28 1930) Pulse Rate:  [92-111] 103 (02/28 1930) Resp:  [13-23] 18 (02/28 1930) BP: (103-134)/(62-76) 113/62 mmHg (02/28 1930) SpO2:  [98 %-100 %] 100 % (02/28 1930) FiO2 (%):  [50 %-100 %] 50 % (02/28 1900)  Physical Examination: General:  Intubated, sedated, well developed, not cachectic Neuro:  Pupils equal and very sluggish 3-60mm, GCS 7t (E2V1M4), withdraws all extremities to pain, opens eyes to pain, all extremities equally strong, has occasional arms jerks HEENT:  Intubated, atraumatic Neck:  No goiter, no trauma, no injury, trache Cardiovascular:  Tachycardic, S1S2 regular, no loud murmur Lungs:  Slightly decreased breath sounds bilaterally but no wheeze, no rales Abdomen:  Flat, soft, no guarding Musculoskeletal:  Well developed, no edema Skin:  Has some injection marks on his antecubital and dorsal hands area  Principal Problem:   Multiple drug overdose Active Problems:   Lactic acidosis   Acute respiratory failure with hypoxia   ASSESSMENT AND PLAN  PULMONARY  Recent Labs Lab 12/19/12 1903  PHART 7.295*  PCO2ART 58.5*  PO2ART 321.0*  HCO3 28.4*  O2SAT 100.0   Ventilator Settings: Vent Mode:  [-] PRVC FiO2 (%):  [  50 %-100 %] 50 % Set Rate:  [12 bmp-18 bmp] 18 bmp Vt Set:  [590 mL] 590 mL PEEP:  [5 cmH20] 5 cmH20 Plateau Pressure:  [8 cmH20] 8 cmH20 CXR:  Decreased left lung volume and vague infrahilar opacity. Favor atelectasis, but aspiration possible. Asymmetric pulmonary edema not suspected at this time ETT:  Initially too high at the clavicles level,  already advanced  A:  Respiratory acidosis most likely due to opioid + benzo P:   Slowly wean from ventilator, will adjust setting according to ABG results Will try to change sedation from propofol to precedex to hopefully aid the vent weaning  CARDIOVASCULAR  Recent Labs Lab 12/19/12 1725  LATICACIDVEN 6.3*   ECG:  Sinus tachy, QTc and QRS normal, no acute ST or T wave changes Lines: peripheral  A: Sinus tachy possibly from vascular dilation due to drugs (distributive) but not in shock P:  Maintenance IVF NS@125mL /hr for now Monitor Lactate level Avoid Beta Blocker since patient is positive for cocaine.  RENAL  Recent Labs Lab 12/19/12 1724  NA 138  K 4.2  CL 101  CO2 22  BUN 15  CREATININE 1.03  CALCIUM 9.1   Intake/Output   None    Foley:  yes  A:  Currently no evidence of AKI P:   IVF maintenance, monitor urine output Will send for CPK to rule out rhabdo  GASTROINTESTINAL  Recent Labs Lab 12/19/12 1724  AST 112*  ALT 95*  ALKPHOS 88  BILITOT 0.2*  PROT 7.1  ALBUMIN 3.8    A:  Mild elevation of liver enzymes P:   Liver enzymes right now relatively nonspecific, will continue to trend If continues to rise significantly then will obtain US abdomen Will hold nutrition for now in anticipation of probable extubation tomorrow if his mental status improves GI prophylaxis with Nexium  HEMATOLOGIC  Recent Labs Lab 12/19/12 1724  HGB 13.7  HCT 39.3  PLT 193   A:  No evidence of sepsis for now P:  Trend CBC  INFECTIOUS  Recent Labs Lab 12/19/12 1724  WBC 6.4   Cultures: Not indicated for now Antibiotics: Not indicated for noe  A:  Currently low suspicion for infection or sepsis P:   Monitor vitals, if becomes hypotensive or febrile then will consider cultureing and starting abx covering for aspiration PNA (not indicated for now)  ENDOCRINE  Recent Labs Lab 12/19/12 1759  GLUCAP 314*   A:  hypergltycemia likely reactive P:    Monitor fingerstick, with sliding scale insulin as needed  NEUROLOGIC  A:  Altered mental status likely due to multiple drug abuse. Unclear right now whether he has any anoxic injury or seizure P:   Intubation and wean as tolerated CT negative for now, if there is any worsening of mental status then will consider repeat CT or MRI Ativan PRN if there is any seizure  BEST PRACTICE / DISPOSITION Level of Care:  Critical Primary Service:  Critical Care Code Status:  Full Diet:  NPO for now DVT Px:  Lovenox GI Px:  Nexium   Wadie Lessen, M.D. Pulmonary and Critical Care Medicine [facility] Pager: [phone]  12/19/2012, 7:59 PM

## 2012-12-19 NOTE — ED Notes (Signed)
Care transferred to 2100 staff, no changes, pt calm, NAD, arousable to physical stimuli, opens eyes, MAEx4. VSS. HR temp increasing & BP decreasing reported to receiving staff. Pharmacy called re: propofol messages.

## 2012-12-20 ENCOUNTER — Inpatient Hospital Stay (HOSPITAL_COMMUNITY): Payer: Self-pay

## 2012-12-20 DIAGNOSIS — Z5189 Encounter for other specified aftercare: Secondary | ICD-10-CM

## 2012-12-20 DIAGNOSIS — E872 Acidosis: Secondary | ICD-10-CM

## 2012-12-20 DIAGNOSIS — J9600 Acute respiratory failure, unspecified whether with hypoxia or hypercapnia: Secondary | ICD-10-CM

## 2012-12-20 LAB — CBC WITH DIFFERENTIAL/PLATELET
Basophils Absolute: 0 10*3/uL (ref 0.0–0.1)
Eosinophils Absolute: 0 10*3/uL (ref 0.0–0.7)
Eosinophils Relative: 0 % (ref 0–5)
Lymphocytes Relative: 24 % (ref 12–46)
Lymphs Abs: 1.4 10*3/uL (ref 0.7–4.0)
MCH: 31.2 pg (ref 26.0–34.0)
MCV: 88.6 fL (ref 78.0–100.0)
Neutrophils Relative %: 68 % (ref 43–77)
Platelets: 168 10*3/uL (ref 150–400)
RBC: 3.94 MIL/uL — ABNORMAL LOW (ref 4.22–5.81)
RDW: 13.5 % (ref 11.5–15.5)
WBC: 6 10*3/uL (ref 4.0–10.5)

## 2012-12-20 LAB — GLUCOSE, CAPILLARY
Glucose-Capillary: 83 mg/dL (ref 70–99)
Glucose-Capillary: 79 mg/dL (ref 70–99)

## 2012-12-20 LAB — COMPREHENSIVE METABOLIC PANEL
Albumin: 3.2 g/dL — ABNORMAL LOW (ref 3.5–5.2)
BUN: 15 mg/dL (ref 6–23)
Creatinine, Ser: 0.93 mg/dL (ref 0.50–1.35)
GFR calc Af Amer: >90 mL/min (ref >90)
Glucose, Bld: 87 mg/dL (ref 70–99)
Total Protein: 5.9 g/dL — ABNORMAL LOW (ref 6.0–8.3)

## 2012-12-20 LAB — CBC
MCV: 88.4 fL (ref 78.0–100.0)
Platelets: 145 10*3/uL — ABNORMAL LOW (ref 150–400)
RBC: 3.52 MIL/uL — ABNORMAL LOW (ref 4.22–5.81)
RDW: 13.7 % (ref 11.5–15.5)
WBC: 7.2 10*3/uL (ref 4.0–10.5)

## 2012-12-20 LAB — CK: Total CK: 106 U/L (ref 7–232)

## 2012-12-20 LAB — MAGNESIUM: Magnesium: 1.5 mg/dL (ref 1.5–2.5)

## 2012-12-20 LAB — POCT I-STAT 3, ART BLOOD GAS (G3+)
Bicarbonate: 23.4 mEq/L (ref 20.0–24.0)
O2 Saturation: 97 %
TCO2: 24 mmol/L (ref 0–100)
pCO2 arterial: 38.5 mmHg (ref 35.0–45.0)
pH, Arterial: 7.4 (ref 7.350–7.450)
pO2, Arterial: 103 mmHg — ABNORMAL HIGH (ref 80.0–100.0)

## 2012-12-20 LAB — TROPONIN I
Troponin I: <0.3 ng/mL (ref <0.30)
Troponin I: <0.3 ng/mL (ref <0.30)

## 2012-12-20 LAB — PHOSPHORUS: Phosphorus: 2.9 mg/dL (ref 2.3–4.6)

## 2012-12-20 MED ORDER — NOREPINEPHRINE BITARTRATE 1 MG/ML IJ SOLN
2.0000 ug/min | INTRAVENOUS | Status: DC
  Administered 2012-12-20: 2 ug/min via INTRAVENOUS
  Filled 2012-12-20: qty 16

## 2012-12-20 MED ORDER — SODIUM CHLORIDE 0.9 % IV SOLN: 6.0000 g | Freq: Once | INTRAVENOUS | Status: DC

## 2012-12-20 MED ORDER — SODIUM CHLORIDE 0.9 % IV SOLN
6.0000 g | Freq: Once | INTRAVENOUS | Status: AC
  Administered 2012-12-20: 6 g via INTRAVENOUS
  Filled 2012-12-20: qty 12

## 2012-12-20 MED ORDER — BIOTENE DRY MOUTH MT LIQD
15.0000 mL | Freq: Four times a day (QID) | OROMUCOSAL | Status: DC
  Administered 2012-12-20: 15 mL via OROMUCOSAL

## 2012-12-20 MED ORDER — POTASSIUM CHLORIDE 10 MEQ/50ML IV SOLN: 10.0000 meq | INTRAVENOUS | Status: DC

## 2012-12-20 MED ORDER — CHLORHEXIDINE GLUCONATE 0.12 % MT SOLN
15.0000 mL | Freq: Two times a day (BID) | OROMUCOSAL | Status: DC
  Administered 2012-12-20: 15 mL via OROMUCOSAL
  Filled 2012-12-20: qty 15

## 2012-12-20 MED ORDER — SODIUM CHLORIDE 0.9 % IV BOLUS (SEPSIS)
1000.0000 mL | Freq: Once | INTRAVENOUS | Status: AC
  Administered 2012-12-20: 1000 mL via INTRAVENOUS

## 2012-12-20 MED ORDER — SODIUM CHLORIDE 0.9 % IV BOLUS (SEPSIS)
500.0000 mL | Freq: Once | INTRAVENOUS | Status: AC
  Administered 2012-12-20: 500 mL via INTRAVENOUS

## 2012-12-20 MED ORDER — POTASSIUM CHLORIDE 10 MEQ/50ML IV SOLN
10.0000 meq | INTRAVENOUS | Status: AC
  Administered 2012-12-20 (×4): 10 meq via INTRAVENOUS
  Filled 2012-12-20 (×4): qty 50

## 2012-12-20 MED ORDER — ONDANSETRON HCL 4 MG/2ML IJ SOLN
4.0000 mg | Freq: Four times a day (QID) | INTRAMUSCULAR | Status: DC | PRN
  Filled 2012-12-20: qty 2

## 2012-12-20 NOTE — Progress Notes (Signed)
This patient is ordered to be on the [facility] electrolyte protocol.   Potassium: 3.1  Magnesium: 1.4  I will be sending over a replacement for potassium and magnesium  Ronal Fear, RN, BSN

## 2012-12-20 NOTE — Progress Notes (Signed)
eLink Physician-Brief Progress Note Patient Name: Richard Logan DOB: [birth year] MRN: [id number]  Date of Service  12/20/2012   HPI/Events of Note  Continued hypotension despite 1 liter of NS over 2 hours with current cuff BP of 81/36 (46).  Have also reduced sedation.  eICU Interventions  Plan: 2 liter of NS now for BP support Consider aline placement if not response to this fluid bolus May need CVL   Intervention Category Intermediate Interventions: Hypotension - evaluation and management  DETERDING,ELIZABETH 12/20/2012, 12:56 AM

## 2012-12-20 NOTE — Progress Notes (Addendum)
Name: Richard Logan MRN: [id number] DOB: [birth year]    LOS: 1  Referring Provider:  ED Reason for Referral:  Intubated for Respiratory failure due to multiple drug overdose  PULMONARY / CRITICAL CARE MEDICINE    Brief patient description:  Richard Logan male with unknown medical history, brought in for multiple drug overdose, intubated for airway protection. Has mixed resp and metabolic acidosis (lactate). Possible aspiration but low suspicion of sepsis/PNA at the moment.  Vital Signs: Temp:  [95.7 F (35.4 C)-101.7 F (38.7 C)] 100.8 F (38.2 C) (03/01 0815) Pulse Rate:  [89-118] 91 (03/01 0815) Resp:  [13-23] 18 (03/01 0815) BP: (74-134)/(35-76) 107/61 mmHg (03/01 0815) SpO2:  [94 %-100 %] 96 % (03/01 0815) Arterial Line BP: (69-90)/(41-48) 90/48 mmHg (03/01 0500) FiO2 (%):  [42.4 %-100 %] 42.4 % (03/01 0815) Weight:  [70 kg (154 lb 5.2 oz)] 70 kg (154 lb 5.2 oz) (02/28 2208)  Physical Examination: General:  Intubated, sedated, well developed, not cachectic Neuro:  Pupils equal and very sluggish 3-73mm, follows commands HEENT:  Intubated, atraumatic Neck:  No goiter, no trauma, no injury, trache Cardiovascular:  Tachycardic, S1S2 regular, no loud murmur Lungs:  Slightly decreased breath sounds bilaterally but no wheeze, no rales Abdomen:  Flat, soft, no guarding Musculoskeletal:  Well developed, no edema Skin:  Has some injection marks on his antecubital and dorsal hands area  Principal Problem:   Multiple drug overdose Active Problems:   Lactic acidosis   Acute respiratory failure with hypoxia   ASSESSMENT AND PLAN  PULMONARY  Recent Labs Lab 12/19/12 1903 12/20/12 0006  PHART 7.295* 7.400  PCO2ART 58.5* 38.5  PO2ART 321.0* 103.0*  HCO3 28.4* 23.4  O2SAT 100.0 97.0   Ventilator Settings: Vent Mode:  [-] PSV;CPAP FiO2 (%):  [42.4 %-100 %] 42.4 % Set Rate:  [12 bmp-18 bmp] 18 bmp Vt Set:  [590 mL] 590 mL PEEP:  [5 cmH20] 5 cmH20 Pressure Support:  [5 cmH20] 5  cmH20 Plateau Pressure:  [8 cmH20-28 cmH20] 16 cmH20   A:  Respiratory acidosis most likely due to opioid + benzo P:   SBts- tolerates 5/5 - extubate   CARDIOVASCULAR  Recent Labs Lab 12/19/12 1725 12/20/12 0010 12/20/12 0403 12/20/12 0445  TROPONINI  --   --  <0.30 <0.30  LATICACIDVEN 6.3* 0.8  --   --    ECG:  Sinus tachy, QTc and QRS normal, no acute ST or T wave changes   A: Sinus tachy possibly from vascular dilation due to drugs (distributive)  Hypotension P:  Maintenance IVF NS@125mL /hr for now Lactate level nmlised Avoid Beta Blocker since patient is positive for cocaine.   RENAL  Recent Labs Lab 12/19/12 1724 12/19/12 2057 12/20/12 0009 12/20/12 0445  NA 138 140 138  --   K 4.2 4.0 3.1*  --   CL 101 104 104  --   CO2 22 25 23   --   BUN 15 15 15   --   CREATININE 1.03 0.95 0.93  --   CALCIUM 9.1 8.8 8.5  --   MG  --   --  1.5 1.4*  PHOS  --   --  2.9  --    Intake/Output     02/28 0701 - 03/01 0700 03/01 0701 - 03/02 0700   I.V. (mL/kg) 1226.2 (17.5)    NG/GT 160    IV Piggyback 2020    Total Intake(mL/kg) 3406.2 (48.7)    Urine (mL/kg/hr) 875    Total Output 875  Net +2531.2           Foley:  yes  A:  Hypokalemia hypomag No rhabdo P:  replete   GASTROINTESTINAL  Recent Labs Lab 12/19/12 1724 12/19/12 2057 12/20/12 0009  AST 112* 92* 56*  ALT 95* 84* 69*  ALKPHOS 88 81 72  BILITOT 0.2* 0.5 0.6  PROT 7.1 6.7 5.9*  ALBUMIN 3.8 3.7 3.2*    A:  Mild elevation of liver enzymes P:   Start PO after extubation GI prophylaxis   HEMATOLOGIC  Recent Labs Lab 12/19/12 1724 12/20/12 0009 12/20/12 0445  HGB 13.7 12.3* 10.7*  HCT 39.3 34.9* 31.1*  PLT 193 168 145*   A:  No evidence of sepsis for now P:  Trend CBC  INFECTIOUS  Recent Labs Lab 12/19/12 1724 12/20/12 0009 12/20/12 0445  WBC 6.4 6.0 7.2   Cultures: Not indicated for now Antibiotics: Not indicated for noe  A:  Currently low suspicion for  infection or sepsis P:   Monitor vitals, if becomes hypotensive or febrile then will consider covering for aspiration PNA (not indicated for now)  ENDOCRINE  Recent Labs Lab 12/19/12 1759 12/19/12 2229 12/20/12 0022 12/20/12 0403 12/20/12 0722  GLUCAP 314* 95 83 79 79   A:  hypergltycemia likely reactive P:   Monitor fingerstick, with sliding scale insulin as needed  NEUROLOGIC  A:  Altered mental status likely due to multiple drug abuse. Unclear right now whether he has any anoxic injury or seizure. Resolved awake and alert 3-1 P:   CT negative  Ativan PRN if there is any seizure  BEST PRACTICE / DISPOSITION Level of Care:  Critical Primary Service:  Critical Care Code Status:  Full Diet:  NPO for now DVT Px:  Lovenox GI Px:  PPI  Global: Wean to extubate once stable.  Brett Canales Minor ACNP [facility] PCCM Pager [phone] till 3 pm If no answer page [phone]  Care during the described time interval was provided by me and/or other providers on the critical care team.  I have reviewed this patient's available data, including medical history, events of note, physical examination and test results as part of my evaluation  CC time x 37m  ALVA,RAKESH V.  12/20/2012, 8:24 AM

## 2012-12-20 NOTE — Progress Notes (Signed)
eLink Physician-Brief Progress Note Patient Name: Muhamad Serano DOB: January 08, 1986 MRN: [id number]  Date of Service  12/20/2012   HPI/Events of Note  Hypotension with BP of 77/39 (49) in the setting of sedation with precedex.  HR of 100.  Had received 500 cc bolus of NS for BP support with now real change in hypotension.  eICU Interventions  Plan: 500 cc NS additional fluid bolus Reduce precedex sedation If no response to this bolus will place aline for HD monitoring.   Intervention Category Intermediate Interventions: Hypotension - evaluation and management  Shondra Capps 12/20/2012, 12:21 AM

## 2012-12-20 NOTE — Procedures (Signed)
Central Venous Catheter Insertion Procedure Note Richard Logan [id number] 18-Jul-1986  Procedure: Insertion of Central Venous Catheter Indications: Assessment of intravascular volume and hypotension possibly requiring pressor  Procedure Details Consent: emergent nature Time Out: Verified patient identification, verified procedure, site/side was marked, verified correct patient position, special equipment/implants available, medications/allergies/relevent history reviewed, required imaging and test results available.  Performed  Maximum sterile technique was used including antiseptics, cap, gloves, gown, hand hygiene, mask and sheet. Skin prep: Chlorhexidine; local anesthetic administered A antimicrobial bonded/coated triple lumen catheter was placed in the left internal jugular vein using the Seldinger technique.  Evaluation Blood flow good Complications: No apparent complications Patient did tolerate procedure well. Chest X-ray ordered to verify placement.  CXR: pending.  Wilmer Berryhill 12/20/2012, 2:08 AM

## 2012-12-20 NOTE — Procedures (Signed)
Extubation Procedure Note  Patient Details:   Name: Richard Logan DOB: [birth year] MRN: [id number]   Airway Documentation:  Airway 8 mm (Active)  Secured at (cm) 27 cm 12/20/2012  8:15 AM  Measured From Lips 12/20/2012  8:15 AM  Secured Location Left 12/20/2012  8:15 AM  Secured By [employer] 12/20/2012  8:15 AM  Tube Holder Repositioned Yes 12/20/2012  8:15 AM  Cuff Pressure (cm H2O) 25 cm H2O 12/19/2012 11:53 PM  Site Condition Dry 12/20/2012  4:00 AM    Evaluation  O2 sats: stable throughout Complications: No apparent complications Patient did tolerate procedure well. Bilateral Breath Sounds: Clear;Diminished Suctioning: Airway Yes  Pt able to speak and is aware of place and who he is. Placed on 3L [place] and tolerating well.  Devra Dopp D 12/20/2012, 10:00 AM

## 2012-12-20 NOTE — ED Provider Notes (Signed)
I saw and evaluated the patient, reviewed the resident's note and I agree with the findings and plan. The patient was brought here in the back of the car for evaluation after what I am told is a heroin overdose.  His girlfriend found him unresponsive and not breathing with vomit around him.  She brought him here, hen left and no further history is obtainable.    The patient arrived here by private auto.  The patient was immediately taken into the resuscitation room where he was seen immediately by myself and Dr. Piedad Climes.  When I arrived, the nursing staff was starting to apply the monitors and respirate with bag-valve-mask.  He was extremely cyanotic, initial oxygen saturations were in the 20's, and initial GCS was 3.  He was difficult to bag and it appears as though he had aspirated his vomit.  Intubation was performed by Dr. Piedad Climes using rsi with an 8-0 endotrachial tube.  Placement was confirmed with ET co2 and auscultation of the lungs and stomach.    Workup was initiated including labs and ct of the head.  The ct scan showed no intracranial abnormality and lab work was remarkable for a positive tox screen.  There was no response to narcan.  As the sedation wore off it appears as though he began to exhibit decorticate posturing.  He eventually required a propofol drip.  PCCM was consulted and will admit the patient.    The patient arrived to the ED by private auto the gravity of the situation was immediately identified by the nursing staff.  He was taken to the room and an airway was established very quickly.  Despite this, I am concerned about the movements I witnessed after the sedation began to wear off.  My concerns are for an anoxic brain injury.  He will be admitted to the ICU and observed clinically.   CRITICAL CARE Performed by: Geoffery Lyons   Total critical care time: 70 minutes  Critical care time was exclusive of separately billable procedures and treating other patients.  Critical  care was necessary to treat or prevent imminent or life-threatening deterioration.  Critical care was time spent personally by me on the following activities: development of treatment plan with patient and/or surrogate as well as nursing, discussions with consultants, evaluation of patient's response to treatment, examination of patient, obtaining history from patient or surrogate, ordering and performing treatments and interventions, ordering and review of laboratory studies, ordering and review of radiographic studies, pulse oximetry and re-evaluation of patient's condition.   Geoffery Lyons, MD 12/20/12 [phone]

## 2012-12-20 NOTE — Procedures (Signed)
Name: [name] MRN: [id number] DOB: 11/28/1985  DOS: 12/20/12  PROCEDURE NOTE  Procedure:  Arterial catheter placement.  Indications:  Need for invasive hemodynamic monitoring / frequent arterial blood gases measurement.  Consent:  Consent was implied due to the emergency nature of the procedure.  Procedure summary:  The patient was identified as Richard Logan and safety timeout was performed. Collateral arterial flow was confirmed by performing Allen's test. Sterile technique was used. The patient's left wrist was prepped using chlorhexidine / alcohol scrub and the field was draped in usual sterile fashion with protective barrier. The left radial artery was cannulated without difficulty. Blood was aspirated and the catheter was flushed with normal saline without difficulty. Good arterial waveform was obtained. The catheter was secured into place with sterile dressing.  Complications:  No immediate complications were noted.  Estimated blood loss:  Less then 5 mL  Wadie Lessen MD  12/20/2012, 2:39 AM

## 2012-12-21 ENCOUNTER — Inpatient Hospital Stay (HOSPITAL_COMMUNITY): Payer: Self-pay

## 2012-12-21 DIAGNOSIS — T50901A Poisoning by unspecified drugs, medicaments and biological substances, accidental (unintentional), initial encounter: Secondary | ICD-10-CM

## 2012-12-21 LAB — PHOSPHORUS: Phosphorus: 2.2 mg/dL — ABNORMAL LOW (ref 2.3–4.6)

## 2012-12-21 LAB — CBC
HCT: 34 % — ABNORMAL LOW (ref 39.0–52.0)
Hemoglobin: 11.9 g/dL — ABNORMAL LOW (ref 13.0–17.0)
MCHC: 35 g/dL (ref 30.0–36.0)
MCV: 89 fL (ref 78.0–100.0)
RDW: 13.5 % (ref 11.5–15.5)
WBC: 7 10*3/uL (ref 4.0–10.5)

## 2012-12-21 LAB — EXPECTORATED SPUTUM ASSESSMENT W GRAM STAIN, RFLX TO RESP C: Special Requests: NORMAL

## 2012-12-21 LAB — MAGNESIUM: Magnesium: 2.1 mg/dL (ref 1.5–2.5)

## 2012-12-21 LAB — BASIC METABOLIC PANEL
CO2: 24 mEq/L (ref 19–32)
Calcium: 9.1 mg/dL (ref 8.4–10.5)
GFR calc non Af Amer: >90 mL/min (ref >90)
Sodium: 140 mEq/L (ref 135–145)

## 2012-12-21 LAB — POCT I-STAT 3, ART BLOOD GAS (G3+)
O2 Saturation: 94 %
TCO2: 25 mmol/L (ref 0–100)
pCO2 arterial: 36.9 mmHg (ref 35.0–45.0)
pH, Arterial: 7.418 (ref 7.350–7.450)
pO2, Arterial: 71 mmHg — ABNORMAL LOW (ref 80.0–100.0)

## 2012-12-21 LAB — URINE MICROSCOPIC-ADD ON

## 2012-12-21 LAB — URINALYSIS, ROUTINE W REFLEX MICROSCOPIC
Bilirubin Urine: NEGATIVE
Nitrite: NEGATIVE
Specific Gravity, Urine: 1.011 (ref 1.005–1.030)
pH: 7.5 (ref 5.0–8.0)

## 2012-12-21 LAB — GLUCOSE, CAPILLARY: Glucose-Capillary: 120 mg/dL — ABNORMAL HIGH (ref 70–99)

## 2012-12-21 LAB — STREP PNEUMONIAE URINARY ANTIGEN: Strep Pneumo Urinary Antigen: NEGATIVE

## 2012-12-21 MED ORDER — IPRATROPIUM BROMIDE 0.02 % IN SOLN
0.5000 mg | Freq: Four times a day (QID) | RESPIRATORY_TRACT | Status: DC
  Administered 2012-12-21 – 2012-12-22 (×5): 0.5 mg via RESPIRATORY_TRACT
  Filled 2012-12-21 (×5): qty 2.5

## 2012-12-21 MED ORDER — LEVALBUTEROL HCL 0.63 MG/3ML IN NEBU
0.6300 mg | INHALATION_SOLUTION | Freq: Four times a day (QID) | RESPIRATORY_TRACT | Status: DC
  Administered 2012-12-21 – 2012-12-22 (×4): 0.63 mg via RESPIRATORY_TRACT
  Filled 2012-12-21 (×10): qty 3

## 2012-12-21 MED ORDER — SODIUM CHLORIDE 0.9 % IV SOLN
3.0000 g | Freq: Four times a day (QID) | INTRAVENOUS | Status: DC
  Administered 2012-12-21 – 2012-12-22 (×4): 3 g via INTRAVENOUS
  Filled 2012-12-21 (×10): qty 3

## 2012-12-21 NOTE — Progress Notes (Signed)
Name: Richard Logan MRN: [id number] DOB: [birth year]    LOS: 2  Referring Sherene Plancarte:  ED Reason for Referral:  Intubated for Respiratory failure due to multiple drug overdose  PULMONARY / CRITICAL CARE MEDICINE    Brief patient description:  26/M  brought in for multiple drug overdose -heroin+ cocaine , intubated for airway protection. Has mixed resp and metabolic acidosis (lactate). Possible aspiration but low suspicion of sepsis/PNA at the moment.  Vital Signs: Temp:  [98.1 F (36.7 C)-100.9 F (38.3 C)] 98.8 F (37.1 C) (03/02 0756) Pulse Rate:  [74-127] 92 (03/02 0500) Resp:  [17-27] 23 (03/02 0500) BP: (95-133)/(41-66) 110/58 mmHg (03/02 0500) SpO2:  [92 %-100 %] 92 % (03/02 0500) Arterial Line BP: (91-149)/(49-69) 127/61 mmHg (03/02 0500) FiO2 (%):  [41.3 %-50.8 %] 41.3 % (03/01 0900)  Physical Examination: General:  Awake , wants to go home Neuro:  intact HEENT:  No lan Neck:  No goiter, no trauma, no injury, trache Cardiovascular:  , S1S2 regular, ur Lungs:  Slightly decreased breath sounds bilaterally l>.r +rhonchi Abdomen:  Flat, soft, no guarding Musculoskeletal:  Well developed, no edema Skin:  Has some injection marks on his antecubital and dorsal hands area  Principal Problem:   Multiple drug overdose Active Problems:   Lactic acidosis   Acute respiratory failure with hypoxia   ASSESSMENT AND PLAN  PULMONARY  Recent Labs Lab 12/19/12 1903 12/20/12 0006  PHART 7.295* 7.400  PCO2ART 58.5* 38.5  PO2ART 321.0* 103.0*  HCO3 28.4* 23.4  O2SAT 100.0 97.0   Ventilator Settings: Vent Mode:  [-] PSV;CPAP FiO2 (%):  [41.3 %-50.8 %] 41.3 % PEEP:  [5 cmH20] 5 cmH20 Pressure Support:  [5 cmH20] 5 cmH20   A:  Respiratory acidosis most likely due to opioid + benzo- resolved LLL atx P:    extubated 3-2 3/2 start is/flutter , oob to chair  CARDIOVASCULAR  Recent Labs Lab 12/19/12 1725 12/20/12 0010 12/20/12 0403 12/20/12 0445 12/20/12 1150  12/20/12 1545  TROPONINI  --   --  <0.30 <0.30 <0.30 <0.30  LATICACIDVEN 6.3* 0.8  --   --   --   --    ECG:  Sinus tachy, QTc and QRS normal, no acute ST or T wave changes   A: Sinus tachy possibly from vascular dilation due to drugs (distributive)  Hypotension(resolved 3/2) P:  Maintenance IVF NS@50  Lactate level nmlised Avoid Beta Blocker since patient is positive for cocaine.   RENAL  Recent Labs Lab 12/19/12 1724 12/19/12 2057 12/20/12 0009 12/20/12 0445 12/20/12 2335 12/21/12 0439  NA 138 140 138  --   --  140  K 4.2 4.0 3.1*  --   --  3.7  CL 101 104 104  --   --  109  CO2 22 25 23   --   --  24  BUN 15 15 15   --   --  7  CREATININE 1.03 0.95 0.93  --   --  0.80  CALCIUM 9.1 8.8 8.5  --   --  9.1  MG  --   --  1.5 1.4* 2.1 2.0  PHOS  --   --  2.9  --   --  2.2*   Intake/Output     03/01 0701 - 03/02 0700 03/02 0701 - 03/03 0700   I.V. (mL/kg) 1380.6 (19.7)    NG/GT     IV Piggyback 150    Total Intake(mL/kg) 1530.6 (21.9)    Urine (mL/kg/hr) 6300 (3.8)  Total Output 6300     Net -4769.4           Foley:  out  A:  Recent Labs Lab 12/19/12 2057 12/20/12 0009 12/21/12 0439  K 4.0 3.1* 3.7       Hypokalemia No rhabdo P:  replete   GASTROINTESTINAL  Recent Labs Lab 12/19/12 1724 12/19/12 2057 12/20/12 0009  AST 112* 92* 56*  ALT 95* 84* 69*  ALKPHOS 88 81 72  BILITOT 0.2* 0.5 0.6  PROT 7.1 6.7 5.9*  ALBUMIN 3.8 3.7 3.2*    A:  Mild elevation of liver enzymes P:   Start PO  GI prophylaxis   HEMATOLOGIC  Recent Labs Lab 12/19/12 1724 12/20/12 0009 12/20/12 0445 12/21/12 0439  HGB 13.7 12.3* 10.7* 11.9*  HCT 39.3 34.9* 31.1* 34.0*  PLT 193 168 145* 161   A:  No evidence of sepsis for now P:  Trend CBC  INFECTIOUS  Recent Labs Lab 12/19/12 1724 12/20/12 0009 12/20/12 0445 12/21/12 0439  WBC 6.4 6.0 7.2 7.0   Cultures: 3/2 sputum>> 3/2 bc x 2>> 3/2 uc>> Antibiotics: 3-2 unasyn>>  A:  Currently low  suspicion for infection or sepsis. 3-2  c x r cw mucus plugging/aspiration P:   -start unasyn - change to augmentin once CXR improves  ENDOCRINE  Recent Labs Lab 12/20/12 1535 12/20/12 1926 12/20/12 2353 12/21/12 0355 12/21/12 0725  GLUCAP 159* 139* 131* 103* 120*   A:  hyperglycemia likely reactive P:   Monitor fingerstick, with sliding scale insulin as needed  NEUROLOGIC  A:  Altered mental status likely due to multiple drug abuse. Unclear right now whether he has any anoxic injury or seizure. Resolved  P:   CT negative  Social work consult for substance abuse  BEST PRACTICE / DISPOSITION Level of Care:  Critical Primary Service:  Critical Care Code Status:  Full Diet:  advance DVT Px:  Lovenox GI Px:  PPI    Brett Canales Minor ACNP [facility] PCCM Pager [phone] till 3 pm If no answer page [phone]  Stable to transfer out of ICU  [facility].

## 2012-12-21 NOTE — Progress Notes (Signed)
Resting in bed, family at bedside, no acute distress noted.

## 2012-12-21 NOTE — Progress Notes (Signed)
Per Brett Canales Minor ACNP patient does not need Recruitment consultant. Will continue to monitor.

## 2012-12-22 ENCOUNTER — Inpatient Hospital Stay (HOSPITAL_COMMUNITY): Payer: Self-pay

## 2012-12-22 DIAGNOSIS — R404 Transient alteration of awareness: Secondary | ICD-10-CM

## 2012-12-22 LAB — BASIC METABOLIC PANEL
BUN: 5 mg/dL — ABNORMAL LOW (ref 6–23)
Calcium: 9.1 mg/dL (ref 8.4–10.5)
Creatinine, Ser: 0.81 mg/dL (ref 0.50–1.35)
GFR calc Af Amer: >90 mL/min (ref >90)
GFR calc non Af Amer: >90 mL/min (ref >90)

## 2012-12-22 LAB — CBC
MCH: 30.4 pg (ref 26.0–34.0)
MCHC: 34.6 g/dL (ref 30.0–36.0)
MCV: 88.1 fL (ref 78.0–100.0)
Platelets: 176 10*3/uL (ref 150–400)
RDW: 13.3 % (ref 11.5–15.5)
WBC: 6.8 10*3/uL (ref 4.0–10.5)

## 2012-12-22 MED ORDER — AMOXICILLIN-POT CLAVULANATE 875-125 MG PO TABS: 1.0000 | ORAL_TABLET | Freq: Two times a day (BID) | ORAL | Status: DC

## 2012-12-22 NOTE — Clinical Social Work Psychosocial (Signed)
Clinical Social Work Department BRIEF PSYCHOSOCIAL ASSESSMENT 12/22/2012  Patient:  Richard Logan, Richard Logan     Account Number:  [id number]     Admit date:  12/19/2012  Clinical Social Worker:  Delmer Islam  Date/Time:  12/22/2012 12:00 M  Referred by:  Physician  Date Referred:  12/21/2012 Referred for  Substance Abuse   Other Referral:   Interview type:  Patient Other interview type:   CSW advised by nurse that patient requesting resources on substance abuse treatment.    PSYCHOSOCIAL DATA Living Status:  ALONE Admitted from facility:   Level of care:   Primary support name:   Primary support relationship to patient:   Degree of support available:   Pt has a girlfriend    CURRENT CONCERNS Current Concerns  Substance Abuse   Other Concerns:    SOCIAL WORK ASSESSMENT / PLAN Patient confirmed that he lived in [place], [place] and was given information on [place] alcohol and drug services. When asked, patient advised that he has transportation to get to [place].   Assessment/plan status:  No Further Intervention Required Other assessment/ plan:   Information/referral to community resources:   Information on drug treatment services in [place]. CSW signing off as no other CSW service needed or requested. Patient discharging home today.    PATIENT'S/FAMILY'S RESPONSE TO PLAN OF CARE: Patient appreciative of information provided.

## 2012-12-22 NOTE — Discharge Summary (Signed)
Physician Discharge Summary  Patient ID: Richard Logan MRN: [id number] DOB/AGE: [birth year] 26 y.o.  Admit date: 12/19/2012 Discharge date: 12/22/2012    Discharge Diagnoses:  Multiple drug overdose Acute Encephalopathy Acute respiratory failure with hypoxia LLL Atelectasis  Sinus Tachycardia Hypotension Lactic acidosis Hypokalemia Mild elevation of LFT's    Brief Summary: Richard Logan is a 26 y.o. y/o male with a PMH of polysubstance abuse admitted on 2/28 with polysubstance overdose.  Found to have UDS positive for cocaine, opiates, and benzodiazepines.  Required intubation in ER due to inability to protect airway.  Known episode of vomiting prior to arrival while passed out.  Evaluation demonstrated altered mental status, sinus tachycardia, hypotension, lactic acidosis and LLL atelectasis / concern for potential aspiration given AMS.  Patient remained intubated until 3/2 and was transferred out of ICU.  Empirically treated for aspiration with unasyn and transitioned to Augmentin for discharge. Blood & respiratory cultures are pending at time of discharge.  Patient had clinically improved, afebrile and no WBC at time of disposition.  Patient denied purposeful attempt at suicide or self harm.     TUBES / LINES OETT 2/28>>>3/2  MICRO DATA  2/28 MRSA PCR>>>neg 3/2 Sputum>>> 3/1 BCx2>>>   SIGNIFICANT DIAGNOSTIC STUDIES  2/28 CT HEAD>>>Normal noncontrast CT appearance of the brain.  Fluid in the nasal cavity and pharynx in the setting of  intubation.                                                                       Hospital Summary by Discharge Diagnosis   Multiple drug overdose Acute Encephalopathy 27 y/o M initially presented to [facility] ER being brought in by girlfriend. He was last seen normal at home around [facility] on 2/28 and left to supposedly play putt-putt with his GF, he drank a little beer (less than 1 can) and took a shot of heroin on his arm around 4PM, then he went  flaccid, passed out, becoming cyanotic, and vomited. Upon presentation in the ED around 20 minutes later, he was reported to be completely flaccid (GCS 3) and very cyanotic, with pulse ox reading in the 30's. Slightly hypothermic at 95.7, slightly tachy at 110 bpm, BP was stable. Apparently not protecting his airway and was intubated in the ED.  Unknown downtime.  After intubation he was initially suspected to be "posturing" and biting his ET tube, was started on propofol drip.  Was later noted to have periodic purposeful movement on all extremities, tube biting, and eye opening upon strong pain stimuli and ET suction. Work up demonstrated a mixed respiratory and metabolic acidosis, elevated lactate. CXR showed possible atelectasis. Noted mucoid secretions when suctioned via ET tube.  Urine positive for opioid, benzo, cocaine. EtOH level 11, acetaminophen level low. CT brain not remarkable.  Had transient hypotension that resolved with volume administration.  Mental status appropriate at time of extubation.   Discharge Plan: -no further follow up in regards to mental status changes -patient given information regarding drug abuse counseling    Acute respiratory failure with hypoxia LLL Atelectasis  Acute respiratory failure in the setting of polysubstance overdose.  LLL atelectasis noted on chest xray. History concerning with vomiting episode. Empirically treated for aspiration while inpatient with Unasyn and transitioned  to Augmentin for discharge.   Discharge Plan: -follow up with PCP as scheduled  -Augmentin for 10 days total   Sinus Tachycardia Hypotension See above summary. Resolved.  No further work up warranted.   Lactic acidosis Hypokalemia Mild elevation of LFT's In setting of acute drug overdose and hypotension.  Resolved. No further work up warranted.  Discharge Exam: General: Awake , wants to go home  Neuro: intact  HEENT: No lan  Neck: No goiter, no trauma, no injury, trache   Cardiovascular: , S1S2 regular, ur  Lungs: resp's even/non-labored on RA, clear on R, mild coarseness LLL Abdomen: Flat, soft, no guarding  Musculoskeletal: Well developed, no edema  Skin: Has some injection marks on his antecubital and dorsal hands area   Filed Vitals:   12/22/12 0437 12/22/12 0835 12/22/12 0922 12/22/12 1153  BP: 104/60  110/66   Pulse: 62  72 88  Temp: 98.8 F (37.1 C)  98.8 F (37.1 C)   TempSrc: Oral  Oral   Resp: 20  20 18   Height:      Weight:      SpO2: 94% 97% 97% 97%   Discharge Labs  BMET  Recent Labs Lab 12/19/12 1724 12/19/12 2057 12/20/12 0009 12/20/12 0445 12/20/12 2335 12/21/12 0439 12/22/12 0645  NA 138 140 138  --   --  140 141  K 4.2 4.0 3.1*  --   --  3.7 3.6  CL 101 104 104  --   --  109 109  CO2 22 25 23   --   --  24 24  GLUCOSE 302* 64* 87  --   --  108* 154*  BUN 15 15 15   --   --  7 5*  CREATININE 1.03 0.95 0.93  --   --  0.80 0.81  CALCIUM 9.1 8.8 8.5  --   --  9.1 9.1  MG  --   --  1.5 1.4* 2.1 2.0  --   PHOS  --   --  2.9  --   --  2.2*  --    CBC  Recent Labs Lab 12/20/12 0445 12/21/12 0439 12/22/12 0645  HGB 10.7* 11.9* 12.3*  HCT 31.1* 34.0* 35.6*  WBC 7.2 7.0 6.8  PLT 145* 161 176     Discharge Orders   Future Orders Complete By Expires     Call MD for:  difficulty breathing, headache or visual disturbances  As directed     Call MD for:  temperature >100.4  As directed     Diet general  As directed     Discharge instructions  As directed     Comments:      Take all antibiotics as prescribed until completed. Follow up with your PCP as above. Recommend outpatient treatment for substance abuse.  Information provided.    Increase activity slowly  As directed            Follow-up Information   Follow up with [facility] at [facility] On 12/24/2012. (Appt at 2:30)    Contact information:   [facility] Building (same building as Dr. Wonda Cerise office) 3rd Floor [address]         Medication List    TAKE these medications       amoxicillin-clavulanate 875-125 MG per tablet  Commonly known as:  AUGMENTIN  Take 1 tablet by mouth 2 (two) times daily.          Disposition: Discharge to home. No acute  home health needs identified during admit.  Patient given information regarding outpatient ETOH / Drug abuse.  Discharged Condition: Richard Logan has met maximum benefit of inpatient care and is medically stable and cleared for discharge.  Patient is pending follow up as above.      Time spent on disposition:  Greater than 35 minutes.   Signed: Canary Brim, NP-C [facility] Pgr: [phone]  Coralyn Helling, MD [facility] 12/22/2012, 1:46 PM Pager:  [phone] After 3pm call: [phone]

## 2012-12-22 NOTE — Progress Notes (Signed)
Patient discharge home, discharge instructions given and explained to patient and family, copies of all forms given.  Patient/family voiced understanding of all instructions.

## 2012-12-23 LAB — CULTURE, RESPIRATORY W GRAM STAIN: Culture: NORMAL

## 2012-12-23 NOTE — Progress Notes (Signed)
Addendum for admission History and Physical note: My critical care time was 45 minutes

## 2012-12-26 LAB — CULTURE, BLOOD (ROUTINE X 2): Culture: NO GROWTH

## 2012-12-27 LAB — CULTURE, BLOOD (ROUTINE X 2): Culture: NO GROWTH

## 2013-01-03 ENCOUNTER — Emergency Department: Payer: Self-pay | Admitting: Emergency Medicine

## 2013-01-03 LAB — COMPREHENSIVE METABOLIC PANEL
Albumin: 3.9 g/dL (ref 3.4–5.0)
Alkaline Phosphatase: 168 U/L — ABNORMAL HIGH (ref 50–136)
Calcium, Total: 9.5 mg/dL (ref 8.5–10.1)
EGFR (Non-African Amer.): >60
Glucose: 104 mg/dL — ABNORMAL HIGH (ref 65–99)
Osmolality: 278 (ref 275–301)
SGOT(AST): 186 U/L — ABNORMAL HIGH (ref 15–37)
SGPT (ALT): 312 U/L — ABNORMAL HIGH (ref 12–78)
Sodium: 139 mmol/L (ref 136–145)

## 2013-01-03 LAB — CBC
HCT: 43.5 % (ref 40.0–52.0)
MCHC: 33.5 g/dL (ref 32.0–36.0)
MCV: 92 fL (ref 80–100)
Platelet: 422 10*3/uL (ref 150–440)
RDW: 14.2 % (ref 11.5–14.5)
WBC: 9.2 10*3/uL (ref 3.8–10.6)

## 2013-01-03 LAB — SALICYLATE LEVEL: Salicylates, Serum: 1.8 mg/dL

## 2013-01-03 LAB — ETHANOL: Ethanol: <3 mg/dL

## 2013-01-04 LAB — URINALYSIS, COMPLETE
Bacteria: NONE SEEN
Bilirubin,UR: NEGATIVE
Blood: NEGATIVE
Glucose,UR: NEGATIVE mg/dL (ref 0–75)
Hyaline Cast: 15
Ketone: NEGATIVE
Ph: 5 (ref 4.5–8.0)
Protein: NEGATIVE
RBC,UR: 2 /HPF (ref 0–5)

## 2013-01-04 LAB — DRUG SCREEN, URINE
Barbiturates, Ur Screen: NEGATIVE (ref ?–200)
Benzodiazepine, Ur Scrn: POSITIVE (ref ?–200)
MDMA (Ecstasy)Ur Screen: NEGATIVE (ref ?–500)
Opiate, Ur Screen: POSITIVE (ref ?–300)
Phencyclidine (PCP) Ur S: NEGATIVE (ref ?–25)

## 2013-01-06 ENCOUNTER — Emergency Department: Payer: Self-pay | Admitting: Emergency Medicine

## 2013-01-06 LAB — CBC
HGB: 13.7 g/dL (ref 13.0–18.0)
MCHC: 33.9 g/dL (ref 32.0–36.0)
MCV: 91 fL (ref 80–100)
Platelet: 307 10*3/uL (ref 150–440)
RDW: 13.9 % (ref 11.5–14.5)
WBC: 6.8 10*3/uL (ref 3.8–10.6)

## 2013-01-06 LAB — DRUG SCREEN, URINE
Barbiturates, Ur Screen: NEGATIVE (ref ?–200)
Benzodiazepine, Ur Scrn: POSITIVE (ref ?–200)
Cannabinoid 50 Ng, Ur ~~LOC~~: NEGATIVE (ref ?–50)
Cocaine Metabolite,Ur ~~LOC~~: NEGATIVE (ref ?–300)
MDMA (Ecstasy)Ur Screen: NEGATIVE (ref ?–500)
Methadone, Ur Screen: NEGATIVE (ref ?–300)
Opiate, Ur Screen: POSITIVE (ref ?–300)
Phencyclidine (PCP) Ur S: NEGATIVE (ref ?–25)
Tricyclic, Ur Screen: NEGATIVE (ref ?–1000)

## 2013-01-06 LAB — COMPREHENSIVE METABOLIC PANEL
Albumin: 3.9 g/dL (ref 3.4–5.0)
Anion Gap: 4 — ABNORMAL LOW (ref 7–16)
Bilirubin,Total: 0.3 mg/dL (ref 0.2–1.0)
Chloride: 105 mmol/L (ref 98–107)
Co2: 31 mmol/L (ref 21–32)
Creatinine: 0.91 mg/dL (ref 0.60–1.30)
EGFR (African American): >60
EGFR (Non-African Amer.): >60
Glucose: 82 mg/dL (ref 65–99)
Osmolality: 277 (ref 275–301)
Potassium: 3.6 mmol/L (ref 3.5–5.1)
Total Protein: 7.9 g/dL (ref 6.4–8.2)

## 2013-01-06 LAB — URINALYSIS, COMPLETE
Bacteria: NONE SEEN
Bilirubin,UR: NEGATIVE
Blood: NEGATIVE
Ketone: NEGATIVE
Leukocyte Esterase: NEGATIVE
Nitrite: NEGATIVE
Protein: NEGATIVE
RBC,UR: 1 /HPF (ref 0–5)
Specific Gravity: 1.014 (ref 1.003–1.030)
Squamous Epithelial: NONE SEEN
WBC UR: 6 /HPF (ref 0–5)

## 2013-01-06 LAB — ETHANOL: Ethanol: <3 mg/dL

## 2013-01-06 LAB — TSH: Thyroid Stimulating Horm: 4.31 u[IU]/mL

## 2013-06-15 ENCOUNTER — Emergency Department: Payer: Self-pay | Admitting: Emergency Medicine

## 2013-06-16 ENCOUNTER — Emergency Department: Payer: Self-pay | Admitting: Emergency Medicine

## 2013-06-16 LAB — TSH: Thyroid Stimulating Horm: 2.2 u[IU]/mL

## 2013-06-16 LAB — CBC
HCT: 41.3 % (ref 40.0–52.0)
HGB: 14.6 g/dL (ref 13.0–18.0)
MCH: 32.1 pg (ref 26.0–34.0)
MCHC: 35.4 g/dL (ref 32.0–36.0)
RDW: 14 % (ref 11.5–14.5)
WBC: 9.2 10*3/uL (ref 3.8–10.6)

## 2013-06-16 LAB — COMPREHENSIVE METABOLIC PANEL
Anion Gap: 5 — ABNORMAL LOW (ref 7–16)
Bilirubin,Total: 0.4 mg/dL (ref 0.2–1.0)
Co2: 28 mmol/L (ref 21–32)
EGFR (African American): >60
Glucose: 88 mg/dL (ref 65–99)
Osmolality: 272 (ref 275–301)
SGOT(AST): 28 U/L (ref 15–37)
Sodium: 136 mmol/L (ref 136–145)

## 2013-06-16 LAB — ETHANOL
Ethanol %: <0.003 % (ref 0.000–0.080)
Ethanol: <3 mg/dL

## 2013-07-26 ENCOUNTER — Emergency Department: Payer: Self-pay | Admitting: Emergency Medicine

## 2013-07-26 LAB — TSH: Thyroid Stimulating Horm: 0.91 u[IU]/mL

## 2013-07-26 LAB — COMPREHENSIVE METABOLIC PANEL
Albumin: 4 g/dL (ref 3.4–5.0)
Anion Gap: 5 — ABNORMAL LOW (ref 7–16)
BUN: 11 mg/dL (ref 7–18)
Bilirubin,Total: 0.4 mg/dL (ref 0.2–1.0)
Calcium, Total: 9.4 mg/dL (ref 8.5–10.1)
Creatinine: 1.1 mg/dL (ref 0.60–1.30)
EGFR (African American): >60
EGFR (Non-African Amer.): >60
Glucose: 91 mg/dL (ref 65–99)
Osmolality: 282 (ref 275–301)
Potassium: 3.6 mmol/L (ref 3.5–5.1)
SGPT (ALT): 25 U/L (ref 12–78)
Sodium: 142 mmol/L (ref 136–145)

## 2013-07-26 LAB — CBC: HCT: 41.7 % (ref 40.0–52.0)

## 2013-09-03 ENCOUNTER — Emergency Department (HOSPITAL_COMMUNITY)
Admission: EM | Admit: 2013-09-03 | Discharge: 2013-09-03 | Disposition: A | Discharge status: Home / Self Care | Payer: Self-pay | Attending: Emergency Medicine | Admitting: Emergency Medicine

## 2013-09-03 ENCOUNTER — Encounter (HOSPITAL_COMMUNITY): Payer: Self-pay | Admitting: Emergency Medicine

## 2013-09-03 DIAGNOSIS — M549 Dorsalgia, unspecified: Secondary | ICD-10-CM | POA: Insufficient documentation

## 2013-09-03 DIAGNOSIS — Z8619 Personal history of other infectious and parasitic diseases: Secondary | ICD-10-CM | POA: Insufficient documentation

## 2013-09-03 DIAGNOSIS — Z87442 Personal history of urinary calculi: Secondary | ICD-10-CM | POA: Insufficient documentation

## 2013-09-03 DIAGNOSIS — F17200 Nicotine dependence, unspecified, uncomplicated: Secondary | ICD-10-CM | POA: Insufficient documentation

## 2013-09-03 DIAGNOSIS — N23 Unspecified renal colic: Secondary | ICD-10-CM

## 2013-09-03 DIAGNOSIS — N200 Calculus of kidney: Secondary | ICD-10-CM | POA: Insufficient documentation

## 2013-09-03 DIAGNOSIS — Z888 Allergy status to other drugs, medicaments and biological substances status: Secondary | ICD-10-CM | POA: Insufficient documentation

## 2013-09-03 HISTORY — DX: Calculus of kidney: N20.0

## 2013-09-03 HISTORY — DX: Spotted fever due to Rickettsia rickettsii: A77.0

## 2013-09-03 HISTORY — DX: Enterocolitis due to Clostridium difficile, not specified as recurrent: A04.72

## 2013-09-03 LAB — CBC WITH DIFFERENTIAL/PLATELET
Basophils Absolute: 0 10*3/uL (ref 0.0–0.1)
Basophils Relative: 0 % (ref 0–1)
Eosinophils Absolute: 0.2 10*3/uL (ref 0.0–0.7)
Eosinophils Relative: 2 % (ref 0–5)
HCT: 41.9 % (ref 39.0–52.0)
Hemoglobin: 14.7 g/dL (ref 13.0–17.0)
MCHC: 35.1 g/dL (ref 30.0–36.0)
Monocytes Absolute: 0.7 10*3/uL (ref 0.1–1.0)
Neutro Abs: 5.5 10*3/uL (ref 1.7–7.7)
Platelets: 266 10*3/uL (ref 150–400)
RDW: 12.7 % (ref 11.5–15.5)

## 2013-09-03 LAB — URINE MICROSCOPIC-ADD ON

## 2013-09-03 LAB — BASIC METABOLIC PANEL
Creatinine, Ser: 1.02 mg/dL (ref 0.50–1.35)
GFR calc Af Amer: >90 mL/min (ref >90)
GFR calc non Af Amer: >90 mL/min (ref >90)
Glucose, Bld: 69 mg/dL — ABNORMAL LOW (ref 70–99)
Potassium: 3.9 mEq/L (ref 3.5–5.1)
Sodium: 139 mEq/L (ref 135–145)

## 2013-09-03 LAB — URINALYSIS, ROUTINE W REFLEX MICROSCOPIC
Leukocytes, UA: NEGATIVE
Nitrite: NEGATIVE
Specific Gravity, Urine: 1.02 (ref 1.005–1.030)
pH: 6 (ref 5.0–8.0)

## 2013-09-03 MED ORDER — MORPHINE SULFATE 4 MG/ML IJ SOLN
4.0000 mg | Freq: Once | INTRAMUSCULAR | Status: AC
  Administered 2013-09-03: 4 mg via INTRAVENOUS
  Filled 2013-09-03: qty 1

## 2013-09-03 MED ORDER — OXYCODONE HCL 10 MG PO TABS: 10.0000 mg | ORAL_TABLET | ORAL | Status: DC | PRN

## 2013-09-03 MED ORDER — IBUPROFEN 600 MG PO TABS
600.0000 mg | ORAL_TABLET | Freq: Three times a day (TID) | ORAL | Status: DC | PRN
Start: 2013-09-03 — End: 2013-09-30

## 2013-09-03 MED ORDER — KETOROLAC TROMETHAMINE 30 MG/ML IJ SOLN
30.0000 mg | Freq: Once | INTRAMUSCULAR | Status: AC
  Administered 2013-09-03: 30 mg via INTRAVENOUS
  Filled 2013-09-03: qty 1

## 2013-09-03 MED ORDER — TAMSULOSIN HCL 0.4 MG PO CAPS: 0.4000 mg | ORAL_CAPSULE | Freq: Every day | ORAL | Status: DC

## 2013-09-03 NOTE — ED Notes (Signed)
Pt believes he has a kidney stone on right lower back.  Decreased urine output and pain with urination.  Denies fevers and vomiting, but has had nausea.  Motrin taken at home at 0600 am.

## 2013-09-03 NOTE — ED Notes (Signed)
Patient is alert and orientedx4.  Patient was explained discharge instructions and they understood them with no questions.  The patient's wife, Khy Pitre is taking the patient home.

## 2013-09-03 NOTE — ED Provider Notes (Signed)
CSN: [id number]     Arrival date & time 09/03/13  0708 History   First MD Initiated Contact with Patient 09/03/13 0715     Chief Complaint  Patient presents with  . Nephrolithiasis   (Consider location/radiation/quality/duration/timing/severity/associated sxs/prior Treatment) HPI Comments: 27 year old male presents with 3 days of right flank pain. He states feels similar to his previous kidney stone 2-3 years ago. Patient states that he otherwise has a constant amount of pain but it as a sharp intermittent pain on top of this. Denies any nausea, vomiting, or fevers. He states he's having trouble urinating has been having some hematuria. He states he thinks he heard one stone hit the toilet bowl yesterday was still having significant pain.   Past Medical History  Diagnosis Date  . Kidney stones   . C. difficile diarrhea   . Rocky Mountain spotted fever    Past Surgical History  Procedure Laterality Date  . Tonsillectomy     No family history on file. History  Substance Use Topics  . Smoking status: Current Every Day Smoker -- 0.50 packs/day    Types: Cigarettes  . Smokeless tobacco: Not on file  . Alcohol Use: No    Review of Systems  Constitutional: Negative for fever and chills.  Gastrointestinal: Negative for nausea, vomiting and abdominal pain.  Genitourinary: Positive for hematuria, flank pain and difficulty urinating. Negative for dysuria.  Musculoskeletal: Positive for back pain.  Neurological: Negative for weakness.  All other systems reviewed and are negative.    Allergies  Tylenol  Home Medications   Current Outpatient Rx  Name  Route  Sig  Dispense  Refill  . ibuprofen (ADVIL,MOTRIN) 200 MG tablet   Oral   Take 400 mg by mouth every 6 (six) hours as needed for moderate pain.         Marland Kitchen amoxicillin-clavulanate (AUGMENTIN) 875-125 MG per tablet   Oral   Take 1 tablet by mouth 2 (two) times daily.   16 tablet   0    BP 130/75  Pulse 103  Temp(Src)  97.7 F (36.5 C) (Oral)  Resp 18  Ht 5\' 11"  (1.803 m)  Wt 155 lb (70.308 kg)  BMI 21.63 kg/m2  SpO2 100% Physical Exam  Nursing note and vitals reviewed. Constitutional: He is oriented to person, place, and time. He appears well-developed and well-nourished. No distress.  HENT:  Head: Normocephalic and atraumatic.  Right Ear: External ear normal.  Left Ear: External ear normal.  Nose: Nose normal.  Eyes: Right eye exhibits no discharge. Left eye exhibits no discharge.  Neck: Neck supple.  Cardiovascular: Normal rate, regular rhythm, normal heart sounds and intact distal pulses.   Pulmonary/Chest: Effort normal.  Abdominal: Soft. There is no tenderness. There is CVA tenderness (right sided).  Musculoskeletal: He exhibits no edema.       Lumbar back: He exhibits tenderness (right sided). He exhibits no bony tenderness.  Neurological: He is alert and oriented to person, place, and time.  Skin: Skin is warm and dry. He is not diaphoretic.    ED Course  Procedures (including critical care time) Labs Review Labs Reviewed  BASIC METABOLIC PANEL - Abnormal; Notable for the following:    Glucose, Bld 69 (*)    All other components within normal limits  URINALYSIS, ROUTINE W REFLEX MICROSCOPIC - Abnormal; Notable for the following:    Hgb urine dipstick LARGE (*)    All other components within normal limits  CBC WITH DIFFERENTIAL  URINE MICROSCOPIC-ADD  ON   Imaging Review No results found.  EKG Interpretation   None       MDM   1. Renal colic on right side    Patient with recurrent renal colic based on signs and symptoms. Pain controlled in ED, no vomiting. Offered imaging but patient declines. I feel that other etiologies such as atypical AAA, spinal fracture or cord syndrome, appendicitis, pyelo, etc are very unlikely. As he has no UTI, fevers or unable to take PO I think it's reasonable to try outpatient therapy with po fluids, tamsulosin, pain control and strainer. Will  encourage urology f/u.    Audree Camel, MD 09/03/13 [phone]

## 2013-09-03 NOTE — ED Notes (Signed)
Pt expresses concern of not wanting CT scan done, d/t unable to pay for scan.  Pt reports "I just had one done last year and it showed 9 stones, I have 6 left."  Pt prefers to have pain control done today. MD Criss Alvine made aware.

## 2013-09-30 ENCOUNTER — Emergency Department (HOSPITAL_COMMUNITY)
Admission: EM | Admit: 2013-09-30 | Discharge: 2013-09-30 | Disposition: A | Discharge status: Home / Self Care | Payer: Self-pay | Attending: Emergency Medicine | Admitting: Emergency Medicine

## 2013-09-30 ENCOUNTER — Encounter (HOSPITAL_COMMUNITY): Payer: Self-pay | Admitting: Emergency Medicine

## 2013-09-30 DIAGNOSIS — Z8619 Personal history of other infectious and parasitic diseases: Secondary | ICD-10-CM | POA: Insufficient documentation

## 2013-09-30 DIAGNOSIS — R296 Repeated falls: Secondary | ICD-10-CM | POA: Insufficient documentation

## 2013-09-30 DIAGNOSIS — R319 Hematuria, unspecified: Secondary | ICD-10-CM | POA: Insufficient documentation

## 2013-09-30 DIAGNOSIS — Y939 Activity, unspecified: Secondary | ICD-10-CM | POA: Insufficient documentation

## 2013-09-30 DIAGNOSIS — Z880 Allergy status to penicillin: Secondary | ICD-10-CM | POA: Insufficient documentation

## 2013-09-30 DIAGNOSIS — Y929 Unspecified place or not applicable: Secondary | ICD-10-CM | POA: Insufficient documentation

## 2013-09-30 DIAGNOSIS — F17200 Nicotine dependence, unspecified, uncomplicated: Secondary | ICD-10-CM | POA: Insufficient documentation

## 2013-09-30 DIAGNOSIS — Z87442 Personal history of urinary calculi: Secondary | ICD-10-CM | POA: Insufficient documentation

## 2013-09-30 LAB — URINALYSIS, ROUTINE W REFLEX MICROSCOPIC
Bilirubin Urine: NEGATIVE
Ketones, ur: 15 mg/dL — AB
Protein, ur: NEGATIVE mg/dL
Specific Gravity, Urine: 1.029 (ref 1.005–1.030)

## 2013-09-30 LAB — URINE MICROSCOPIC-ADD ON

## 2013-09-30 LAB — RAPID URINE DRUG SCREEN, HOSP PERFORMED
Benzodiazepines: POSITIVE — AB
Cocaine: POSITIVE — AB
Opiates: POSITIVE — AB
Tetrahydrocannabinol: POSITIVE — AB

## 2013-09-30 MED ORDER — DIAZEPAM 5 MG PO TABS: 5.0000 mg | ORAL_TABLET | Freq: Once | ORAL | Status: DC

## 2013-09-30 MED ORDER — KETOROLAC TROMETHAMINE 60 MG/2ML IM SOLN
60.0000 mg | Freq: Once | INTRAMUSCULAR | Status: AC
  Administered 2013-09-30: 60 mg via INTRAMUSCULAR
  Filled 2013-09-30: qty 2

## 2013-09-30 NOTE — ED Notes (Signed)
MD at bedside. 

## 2013-09-30 NOTE — ED Provider Notes (Addendum)
CSN: [id number]     Arrival date & time 09/30/13  4098 History   First MD Initiated Contact with Patient 09/30/13 [phone]     No chief complaint on file.  (Consider location/radiation/quality/duration/timing/severity/associated sxs/prior Treatment) The history is provided by the patient.   patient here complaining of right-sided pain after a fall 3 days ago. Also notes history of renal colic but denies any hematuria dysuria. No fever or chills. No nausea or vomiting. No loss of consciousness or head injury with initial fall. Pain characterized as sharp and worse with certain movements. Has used Motrin without relief. On review of the patient's medical record, patient has a history of polysubstance abuse with ICU admission for respiratory failure. Patient also had a abdominal CT performed at [facility] on November 6 which was negative for kidney stone at that time. He was seen here 4 days later with similar symptoms of renal colic and refused a CAT scan at that time.  Past Medical History  Diagnosis Date  . Kidney stones   . C. difficile diarrhea   . Rocky Mountain spotted fever    Past Surgical History  Procedure Laterality Date  . Tonsillectomy     No family history on file. History  Substance Use Topics  . Smoking status: Current Every Day Smoker -- 0.50 packs/day    Types: Cigarettes  . Smokeless tobacco: Not on file  . Alcohol Use: No    Review of Systems  All other systems reviewed and are negative.    Allergies  Penicillins and Tylenol  Home Medications   Current Outpatient Rx  Name  Route  Sig  Dispense  Refill  . ibuprofen (ADVIL,MOTRIN) 200 MG tablet   Oral   Take 600 mg by mouth every 6 (six) hours as needed for moderate pain.          BP 113/61  Pulse 88  Temp(Src) 98.6 F (37 C) (Oral)  Resp 16  SpO2 100% Physical Exam  Nursing note and vitals reviewed. Constitutional: He is oriented to person, place, and time. He appears well-developed and well-nourished.   Non-toxic appearance. No distress.  HENT:  Head: Normocephalic and atraumatic.  Eyes: Conjunctivae, EOM and lids are normal. Pupils are equal, round, and reactive to light.  Neck: Normal range of motion. Neck supple. No tracheal deviation present. No mass present.  Cardiovascular: Normal rate, regular rhythm and normal heart sounds.  Exam reveals no gallop.   No murmur heard. Pulmonary/Chest: Effort normal and breath sounds normal. No stridor. No respiratory distress. He has no decreased breath sounds. He has no wheezes. He has no rhonchi. He has no rales.  Abdominal: Soft. Normal appearance and bowel sounds are normal. He exhibits no distension. There is no tenderness. There is no rebound and no CVA tenderness.  Musculoskeletal: Normal range of motion. He exhibits no edema and no tenderness.       Arms: Neurological: He is alert and oriented to person, place, and time. He has normal strength. No cranial nerve deficit or sensory deficit. GCS eye subscore is 4. GCS verbal subscore is 5. GCS motor subscore is 6.  Skin: Skin is warm and dry. No abrasion and no rash noted.  Psychiatric: He has a normal mood and affect. His speech is normal and behavior is normal.    ED Course  Procedures (including critical care time) Labs Review Labs Reviewed  URINE RAPID DRUG SCREEN (HOSP PERFORMED)  URINALYSIS, ROUTINE W REFLEX MICROSCOPIC   Imaging Review No results found.  EKG Interpretation   None       MDM  No diagnosis found. Patient given Toradol for his pain. Urine drug screen results noted and discussed with patient. Patient does have hematuria and because of the history of trauma and I offered the patient a CT the abdomen for evaluation of possible kidney injury due to the patient's hematuria. He has deferred this time. Risk explained the patient and he accepts this. Patient to followup here if symptoms become worse Patient also given urology referral   Toy Baker, MD 09/30/13  1610  Toy Baker, MD 09/30/13 [phone]

## 2013-09-30 NOTE — ED Notes (Signed)
Patient states he fell on Monday hurting his right flank area. Denies hitting his head or loss of consciousness. Patient is slow in his responses and states he took some left over oxycodone he had at home but he has ran out of it.

## 2013-09-30 NOTE — ED Notes (Signed)
Per Dr. Freida Busman hold Valium due to patients sleepy behavior at this time.

## 2015-12-15 ENCOUNTER — Emergency Department
Admission: EM | Admit: 2015-12-15 | Discharge: 2015-12-15 | Disposition: A | Discharge status: Home / Self Care | Payer: Self-pay

## 2015-12-19 ENCOUNTER — Encounter: Payer: Self-pay | Admitting: *Deleted

## 2015-12-19 ENCOUNTER — Emergency Department
Admission: EM | Admit: 2015-12-19 | Discharge: 2015-12-20 | Disposition: A | Discharge status: Home / Self Care | Payer: Self-pay | Attending: Emergency Medicine | Admitting: Emergency Medicine

## 2015-12-19 ENCOUNTER — Emergency Department: Payer: Self-pay

## 2015-12-19 DIAGNOSIS — F17210 Nicotine dependence, cigarettes, uncomplicated: Secondary | ICD-10-CM | POA: Insufficient documentation

## 2015-12-19 DIAGNOSIS — R509 Fever, unspecified: Secondary | ICD-10-CM | POA: Insufficient documentation

## 2015-12-19 DIAGNOSIS — R110 Nausea: Secondary | ICD-10-CM | POA: Insufficient documentation

## 2015-12-19 DIAGNOSIS — F1910 Other psychoactive substance abuse, uncomplicated: Secondary | ICD-10-CM | POA: Insufficient documentation

## 2015-12-19 DIAGNOSIS — Z880 Allergy status to penicillin: Secondary | ICD-10-CM | POA: Insufficient documentation

## 2015-12-19 DIAGNOSIS — R109 Unspecified abdominal pain: Secondary | ICD-10-CM | POA: Insufficient documentation

## 2015-12-19 LAB — BASIC METABOLIC PANEL
Anion gap: 9 (ref 5–15)
BUN: 11 mg/dL (ref 6–20)
CHLORIDE: 102 mmol/L (ref 101–111)
CO2: 25 mmol/L (ref 22–32)
Calcium: 8.9 mg/dL (ref 8.9–10.3)
Creatinine, Ser: 1.06 mg/dL (ref 0.61–1.24)
GFR calc Af Amer: >60 mL/min (ref >60)
GFR calc non Af Amer: >60 mL/min (ref >60)
GLUCOSE: 126 mg/dL — AB (ref 65–99)
POTASSIUM: 3.5 mmol/L (ref 3.5–5.1)
Sodium: 136 mmol/L (ref 135–145)

## 2015-12-19 LAB — URINALYSIS COMPLETE WITH MICROSCOPIC (ARMC ONLY)
Bacteria, UA: NONE SEEN
Bilirubin Urine: NEGATIVE
GLUCOSE, UA: NEGATIVE mg/dL
Ketones, ur: NEGATIVE mg/dL
Leukocytes, UA: NEGATIVE
Nitrite: NEGATIVE
PROTEIN: NEGATIVE mg/dL
Specific Gravity, Urine: 1.018 (ref 1.005–1.030)
pH: 5 (ref 5.0–8.0)

## 2015-12-19 LAB — CBC
HEMATOCRIT: 36.5 % — AB (ref 40.0–52.0)
Hemoglobin: 12.9 g/dL — ABNORMAL LOW (ref 13.0–18.0)
MCH: 30.8 pg (ref 26.0–34.0)
MCHC: 35.2 g/dL (ref 32.0–36.0)
MCV: 87.3 fL (ref 80.0–100.0)
Platelets: 231 10*3/uL (ref 150–440)
RBC: 4.18 MIL/uL — ABNORMAL LOW (ref 4.40–5.90)
RDW: 13 % (ref 11.5–14.5)
WBC: 9.7 10*3/uL (ref 3.8–10.6)

## 2015-12-19 MED ORDER — POLYETHYLENE GLYCOL 3350 17 G PO PACK: 17.0000 g | PACK | Freq: Every day | ORAL | Status: DC

## 2015-12-19 MED ORDER — ONDANSETRON HCL 4 MG/2ML IJ SOLN
4.0000 mg | Freq: Once | INTRAMUSCULAR | Status: AC
  Administered 2015-12-19: 4 mg via INTRAVENOUS
  Filled 2015-12-19: qty 2

## 2015-12-19 MED ORDER — SODIUM CHLORIDE 0.9 % IV SOLN
Freq: Once | INTRAVENOUS | Status: AC
Start: 2015-12-19 — End: 2015-12-20
  Administered 2015-12-19: 23:00:00 via INTRAVENOUS

## 2015-12-19 MED ORDER — HYDROMORPHONE HCL 1 MG/ML IJ SOLN
1.0000 mg | Freq: Once | INTRAMUSCULAR | Status: AC
  Administered 2015-12-19: 1 mg via INTRAVENOUS
  Filled 2015-12-19: qty 1

## 2015-12-19 MED ORDER — KETOROLAC TROMETHAMINE 30 MG/ML IJ SOLN
30.0000 mg | Freq: Once | INTRAMUSCULAR | Status: AC
  Administered 2015-12-19: 30 mg via INTRAVENOUS
  Filled 2015-12-19: qty 1

## 2015-12-19 NOTE — ED Notes (Signed)
Pt reports right flank pain for 5 days.  Pt has nausea.  Hx of kidney stones

## 2015-12-19 NOTE — ED Provider Notes (Addendum)
[facility] Emergency Department Provider Note     Time seen: ----------------------------------------- 10:31 PM on 12/19/2015 -----------------------------------------    I have reviewed the triage vital signs and the nursing notes.   HISTORY  Chief Complaint Flank Pain    HPI Richard Logan is a 30 y.o. male who presents to ER for sharp right flank pain for the past 5 days. Patient reports history of nausea, doesn't history kidney stones. Typically these skipped better and is able to pass the stone. Patient's also complaining of fevers, chills and body aches.Patient has had some nausea but no vomiting or diarrhea.   Past Medical History  Diagnosis Date  . Kidney stones   . C. difficile diarrhea   . Rocky Mountain spotted fever     Patient Active Problem List   Diagnosis Date Noted  . Lactic acidosis 12/19/2012  . Multiple drug overdose 12/19/2012  . Acute respiratory failure with hypoxia (HCC) 12/19/2012    Past Surgical History  Procedure Laterality Date  . Tonsillectomy      Allergies Penicillins and Tylenol  Social History Social History  Substance Use Topics  . Smoking status: Current Every Day Smoker -- 1.00 packs/day    Types: Cigarettes  . Smokeless tobacco: None  . Alcohol Use: Yes     Comment: occassional    Review of Systems Constitutional: Positive for fevers, chills, aches Eyes: Negative for visual changes. ENT: Negative for sore throat. Cardiovascular: Negative for chest pain. Respiratory: Negative for shortness of breath. Gastrointestinal: Positive right flank pain, nausea Genitourinary: Negative for dysuria. Musculoskeletal: Negative for back pain. Skin: Negative for rash. Neurological: Negative for headaches, focal weakness or numbness.  10-point ROS otherwise negative.  ____________________________________________   PHYSICAL EXAM:  VITAL SIGNS: ED Triage Vitals  Enc Vitals Group     BP 12/19/15  2150 113/71 mmHg     Pulse Rate 12/19/15 2150 110     Resp 12/19/15 2150 18     Temp 12/19/15 2150 99.4 F (37.4 C)     Temp Source 12/19/15 2150 Oral     SpO2 12/19/15 2150 98 %     Weight 12/19/15 2150 160 lb (72.576 kg)     Height 12/19/15 2150  (1.803 m)     Head Cir --      Peak Flow --      Pain Score 12/19/15 2152 8     Pain Loc --      Pain Edu? --      Excl. in GC? --     Constitutional: Alert and oriented. Mild distress Eyes: Conjunctivae are normal. PERRL. Normal extraocular movements. ENT   Head: Normocephalic and atraumatic.   Nose: No congestion/rhinnorhea.   Mouth/Throat: Mucous membranes are moist.   Neck: No stridor. Cardiovascular: Normal rate, regular rhythm. Normal and symmetric distal pulses are present in all extremities. No murmurs, rubs, or gallops. I listened intently for heart murmur and did not hear one. Respiratory: Normal respiratory effort without tachypnea nor retractions. Breath sounds are clear and equal bilaterally. No wheezes/rales/rhonchi. Gastrointestinal: Right flank tenderness, no rebound or guarding. Normal bowel sounds. Musculoskeletal: Nontender with normal range of motion in all extremities. No joint effusions.  No lower extremity tenderness nor edema. Neurologic:  Normal speech and language. No gross focal neurologic deficits are appreciated.  Skin:  Skin is warm, with diaphoresis Psychiatric: Mood and affect are normal. Speech and behavior are normal. Patient exhibits appropriate insight and judgment. ____________________________________________  ED COURSE:  Pertinent  labs & imaging results that were available during my care of the patient were reviewed by me and considered in my medical decision making (see chart for details). Patient with flulike symptoms in addition to renal colic. I'll check basic labs and x-ray. ____________________________________________    LABS (pertinent positives/negatives)  Labs  Reviewed  BASIC METABOLIC PANEL - Abnormal; Notable for the following:    Glucose, Bld 126 (*)    All other components within normal limits  CBC - Abnormal; Notable for the following:    RBC 4.18 (*)    Hemoglobin 12.9 (*)    HCT 36.5 (*)    All other components within normal limits  URINALYSIS COMPLETEWITH MICROSCOPIC ([facility] ONLY) - Abnormal; Notable for the following:    Color, Urine YELLOW (*)    APPearance HAZY (*)    Hgb urine dipstick 3+ (*)    Squamous Epithelial / LPF 0-5 (*)    All other components within normal limits    RADIOLOGY Images were viewed by me  KUB IMPRESSION: Negative. ____________________________________________  FINAL ASSESSMENT AND PLAN  Flank pain, fever  Plan: Patient with labs and imaging as dictated above. Flank pain is probably constipation related. He likely is coming down with the flu as well. He has follow-up with the methadone clinic in the morning, we will obtain blood cultures as he is an IV drug abuser. He is stable for outpatient follow-up at the morning.   Emily Filbert, MD   Emily Filbert, MD 12/19/15 2313  Emily Filbert, MD 12/19/15 [phone]

## 2015-12-19 NOTE — Discharge Instructions (Signed)
Flank Pain Flank pain refers to pain that is located on the side of the body between the upper abdomen and the back. The pain may occur over a short period of time (acute) or may be long-term or reoccurring (chronic). It may be mild or severe. Flank pain can be caused by many things. CAUSES  Some of the more common causes of flank pain include:  Muscle strains.   Muscle spasms.   A disease of your spine (vertebral disk disease).   A lung infection (pneumonia).   Fluid around your lungs (pulmonary edema).   A kidney infection.   Kidney stones.   A very painful skin rash caused by the chickenpox virus (shingles).   Gallbladder disease.  HOME CARE INSTRUCTIONS  Home care will depend on the cause of your pain. In general,  Rest as directed by your caregiver.  Drink enough fluids to keep your urine clear or pale yellow.  Only take over-the-counter or prescription medicines as directed by your caregiver. Some medicines may help relieve the pain.  Tell your caregiver about any changes in your pain.  Follow up with your caregiver as directed. SEEK IMMEDIATE MEDICAL CARE IF:   Your pain is not controlled with medicine.   You have new or worsening symptoms.  Your pain increases.   You have abdominal pain.   You have shortness of breath.   You have persistent nausea or vomiting.   You have swelling in your abdomen.   You feel faint or pass out.   You have blood in your urine.  You have a fever or persistent symptoms for more than 2-3 days.  You have a fever and your symptoms suddenly get worse. MAKE SURE YOU:   Understand these instructions.  Will watch your condition.  Will get help right away if you are not doing well or get worse.   This information is not intended to replace advice given to you by your health care provider. Make sure you discuss any questions you have with your health care provider.   Document Released: 11/29/2005 Document  Revised: 07/02/2012 Document Reviewed: 05/22/2012 Elsevier Interactive Patient Education 2016 [employer].  Polysubstance Abuse When people abuse more than one drug or type of drug it is called polysubstance or polydrug abuse. For example, many smokers also drink alcohol. This is one form of polydrug abuse. Polydrug abuse also refers to the use of a drug to counteract an unpleasant effect produced by another drug. It may also be used to help with withdrawal from another drug. People who take stimulants may become agitated. Sometimes this agitation is countered with a tranquilizer. This helps protect against the unpleasant side effects. Polydrug abuse also refers to the use of different drugs at the same time.  Anytime drug use is interfering with normal living activities, it has become abuse. This includes problems with family and friends. Psychological dependence has developed when your mind tells you that the drug is needed. This is usually followed by physical dependence which has developed when continuing increases of drug are required to get the same feeling or "high". This is known as addiction or chemical dependency. A person's risk is much higher if there is a history of chemical dependency in the family. SIGNS OF CHEMICAL DEPENDENCY  You have been told by friends or family that drugs have become a problem.  You fight when using drugs.  You are having blackouts (not remembering what you do while using).  You feel sick from using  drugs but continue using.  You lie about use or amounts of drugs (chemicals) used.  You need chemicals to get you going.  You are suffering in work performance or in school because of drug use.  You get sick from use of drugs but continue to use anyway.  You need drugs to relate to people or feel comfortable in social situations.  You use drugs to forget problems. "Yes" answered to any of the above signs of chemical dependency indicates there are  problems. The longer the use of drugs continues, the greater the problems will become. If there is a family history of drug or alcohol use, it is best not to experiment with these drugs. Continual use leads to tolerance. After tolerance develops more of the drug is needed to get the same feeling. This is followed by addiction. With addiction, drugs become the most important part of life. It becomes more important to take drugs than participate in the other usual activities of life. This includes relating to friends and family. Addiction is followed by dependency. Dependency is a condition where drugs are now needed not just to get high, but to feel normal. Addiction cannot be cured but it can be stopped. This often requires outside help and the care of professionals. Treatment centers are listed in the yellow pages under: Cocaine, Narcotics, and Alcoholics Anonymous. Most hospitals and clinics can refer you to a specialized care center. Talk to your caregiver if you need help.   This information is not intended to replace advice given to you by your health care provider. Make sure you discuss any questions you have with your health care provider.   Document Released: 05/30/2005 Document Revised: 12/31/2011 Document Reviewed: 10/13/2014 Elsevier Interactive Patient Education [employer]

## 2015-12-20 NOTE — ED Notes (Signed)
Patient with no complaints at this time. Respirations even and unlabored. Skin warm/dry. Discharge instructions reviewed with patient at this time. Patient given opportunity to voice concerns/ask questions. IV removed per policy and band-aid applied to site. Patient discharged at this time and left Emergency Department with steady gait.  

## 2015-12-24 LAB — CULTURE, BLOOD (ROUTINE X 2)
CULTURE: NO GROWTH
Culture: NO GROWTH

## 2019-05-12 ENCOUNTER — Encounter: Payer: Self-pay | Admitting: Emergency Medicine

## 2019-05-12 ENCOUNTER — Emergency Department
Admission: EM | Admit: 2019-05-12 | Discharge: 2019-05-12 | Disposition: A | Discharge status: Home / Self Care | Payer: Medicaid Other | Attending: Emergency Medicine | Admitting: Emergency Medicine

## 2019-05-12 DIAGNOSIS — R5383 Other fatigue: Secondary | ICD-10-CM | POA: Diagnosis present

## 2019-05-12 DIAGNOSIS — F17210 Nicotine dependence, cigarettes, uncomplicated: Secondary | ICD-10-CM | POA: Insufficient documentation

## 2019-05-12 HISTORY — DX: Unspecified viral hepatitis C without hepatic coma: B19.20

## 2019-05-12 NOTE — ED Notes (Signed)
Blood drawn by this RN with patients verbal consent for [place] [employer] blood draw.  Drawn at 1608 from right [facility]. Cleaned with betadine in kit and inverted tubes per protocol.  Given to officer [place] with [place] [employer]

## 2019-05-12 NOTE — ED Notes (Signed)
Pt refusing repeat vital signs  
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
## 2019-05-12 NOTE — Discharge Instructions (Addendum)
Please seek medical attention for any high fevers, chest pain, shortness of breath, change in behavior, persistent vomiting, bloody stool or any other new or concerning symptoms.  
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
## 2019-07-22 ENCOUNTER — Emergency Department
Admission: EM | Admit: 2019-07-22 | Discharge: 2019-07-22 | Disposition: A | Discharge status: Home / Self Care | Payer: Medicaid Other | Attending: Emergency Medicine | Admitting: Emergency Medicine

## 2019-07-22 ENCOUNTER — Other Ambulatory Visit: Payer: Self-pay

## 2019-07-22 DIAGNOSIS — S058X2A Other injuries of left eye and orbit, initial encounter: Secondary | ICD-10-CM | POA: Diagnosis not present

## 2019-07-22 DIAGNOSIS — Y999 Unspecified external cause status: Secondary | ICD-10-CM | POA: Diagnosis not present

## 2019-07-22 DIAGNOSIS — Y9389 Activity, other specified: Secondary | ICD-10-CM | POA: Insufficient documentation

## 2019-07-22 DIAGNOSIS — Y929 Unspecified place or not applicable: Secondary | ICD-10-CM | POA: Diagnosis not present

## 2019-07-22 DIAGNOSIS — S0502XA Injury of conjunctiva and corneal abrasion without foreign body, left eye, initial encounter: Secondary | ICD-10-CM | POA: Diagnosis not present

## 2019-07-22 DIAGNOSIS — F17210 Nicotine dependence, cigarettes, uncomplicated: Secondary | ICD-10-CM | POA: Diagnosis not present

## 2019-07-22 DIAGNOSIS — S0592XA Unspecified injury of left eye and orbit, initial encounter: Secondary | ICD-10-CM

## 2019-07-22 DIAGNOSIS — X58XXXA Exposure to other specified factors, initial encounter: Secondary | ICD-10-CM | POA: Insufficient documentation

## 2019-07-22 MED ORDER — EYE WASH OPHTH SOLN
1.0000 [drp] | OPHTHALMIC | Status: DC | PRN
  Filled 2019-07-22: qty 118

## 2019-07-22 MED ORDER — TETRACAINE HCL 0.5 % OP SOLN
2.0000 [drp] | Freq: Once | OPHTHALMIC | Status: AC
  Administered 2019-07-22: 2 [drp] via OPHTHALMIC
  Filled 2019-07-22: qty 4

## 2019-07-22 MED ORDER — ERYTHROMYCIN 5 MG/GM OP OINT
TOPICAL_OINTMENT | Freq: Once | OPHTHALMIC | Status: AC
  Administered 2019-07-22: 1 via OPHTHALMIC
  Filled 2019-07-22: qty 1

## 2019-07-22 MED ORDER — FLUORESCEIN SODIUM 1 MG OP STRP
1.0000 | ORAL_STRIP | Freq: Once | OPHTHALMIC | Status: AC
  Administered 2019-07-22: 1 via OPHTHALMIC
  Filled 2019-07-22: qty 1

## 2019-07-22 MED ORDER — ERYTHROMYCIN 5 MG/GM OP OINT: 1.0000 "application " | TOPICAL_OINTMENT | Freq: Four times a day (QID) | OPHTHALMIC | 0 refills | Status: AC
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
## 2019-07-22 NOTE — Discharge Instructions (Signed)
It looks like you have a small abrasion to your eye.  Please apply antibiotic ointment as prescribed.  Please call Dr. Melony Overly office in the morning for a follow-up appointment tomorrow or Friday.  Return to the emergency department for worsening of symptoms.

## 2021-03-14 ENCOUNTER — Emergency Department
Admission: EM | Admit: 2021-03-14 | Discharge: 2021-03-14 | Disposition: A | Discharge status: Home / Self Care | Payer: Medicaid Other | Attending: Emergency Medicine | Admitting: Emergency Medicine

## 2021-03-14 ENCOUNTER — Emergency Department: Payer: Medicaid Other

## 2021-03-14 ENCOUNTER — Other Ambulatory Visit: Payer: Self-pay

## 2021-03-14 DIAGNOSIS — Z20822 Contact with and (suspected) exposure to covid-19: Secondary | ICD-10-CM | POA: Diagnosis not present

## 2021-03-14 DIAGNOSIS — R519 Headache, unspecified: Secondary | ICD-10-CM | POA: Insufficient documentation

## 2021-03-14 DIAGNOSIS — F17210 Nicotine dependence, cigarettes, uncomplicated: Secondary | ICD-10-CM | POA: Diagnosis not present

## 2021-03-14 DIAGNOSIS — M79602 Pain in left arm: Secondary | ICD-10-CM | POA: Insufficient documentation

## 2021-03-14 DIAGNOSIS — R079 Chest pain, unspecified: Secondary | ICD-10-CM | POA: Insufficient documentation

## 2021-03-14 LAB — BASIC METABOLIC PANEL
Anion gap: 13 (ref 5–15)
BUN: 18 mg/dL (ref 6–20)
CO2: 25 mmol/L (ref 22–32)
Calcium: 9.8 mg/dL (ref 8.9–10.3)
Chloride: 101 mmol/L (ref 98–111)
Creatinine, Ser: 1 mg/dL (ref 0.61–1.24)
GFR, Estimated: >60 mL/min (ref >60)
Glucose, Bld: 101 mg/dL — ABNORMAL HIGH (ref 70–99)
Potassium: 3.1 mmol/L — ABNORMAL LOW (ref 3.5–5.1)
Sodium: 139 mmol/L (ref 135–145)

## 2021-03-14 LAB — CBC
HCT: 41.1 % (ref 39.0–52.0)
Hemoglobin: 14.2 g/dL (ref 13.0–17.0)
MCH: 30.1 pg (ref 26.0–34.0)
MCHC: 34.5 g/dL (ref 30.0–36.0)
MCV: 87.3 fL (ref 80.0–100.0)
Platelets: 295 10*3/uL (ref 150–400)
RBC: 4.71 MIL/uL (ref 4.22–5.81)
RDW: 12.8 % (ref 11.5–15.5)
WBC: 7 10*3/uL (ref 4.0–10.5)
nRBC: 0 % (ref 0.0–0.2)

## 2021-03-14 LAB — TROPONIN I (HIGH SENSITIVITY): Troponin I (High Sensitivity): 3 ng/L (ref <18)

## 2021-03-14 IMAGING — CR DG CHEST 2V
1 series · 2 of 2 positions shown · non-contrast
Comparison: 12/22/2012

CLINICAL DATA: Chest pain shortness of breath

EXAM:
CHEST - 2 VIEW

[Series 1: w chest pa · 0.14mm/px · 2 of 2 slices shown]
[im 1/2]
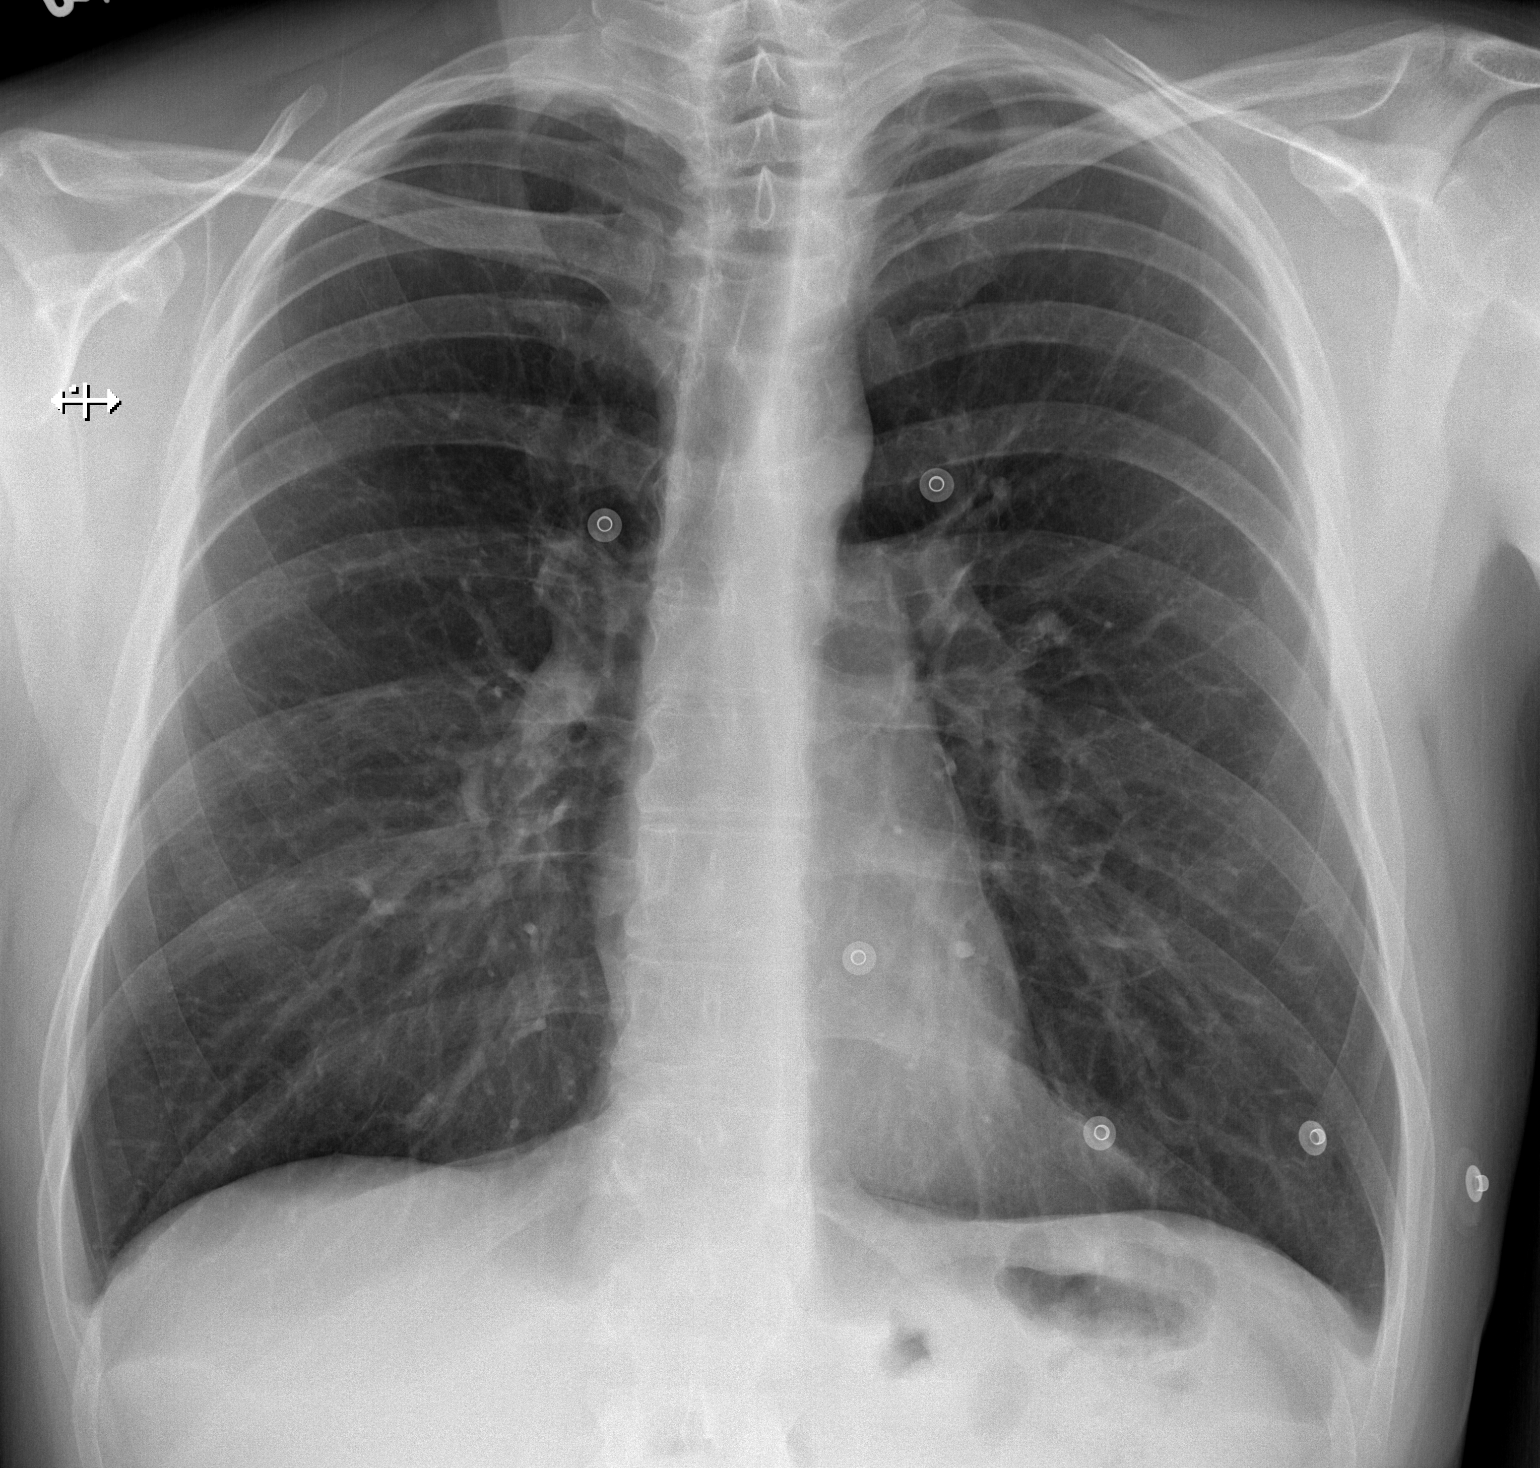
[im 2/2]
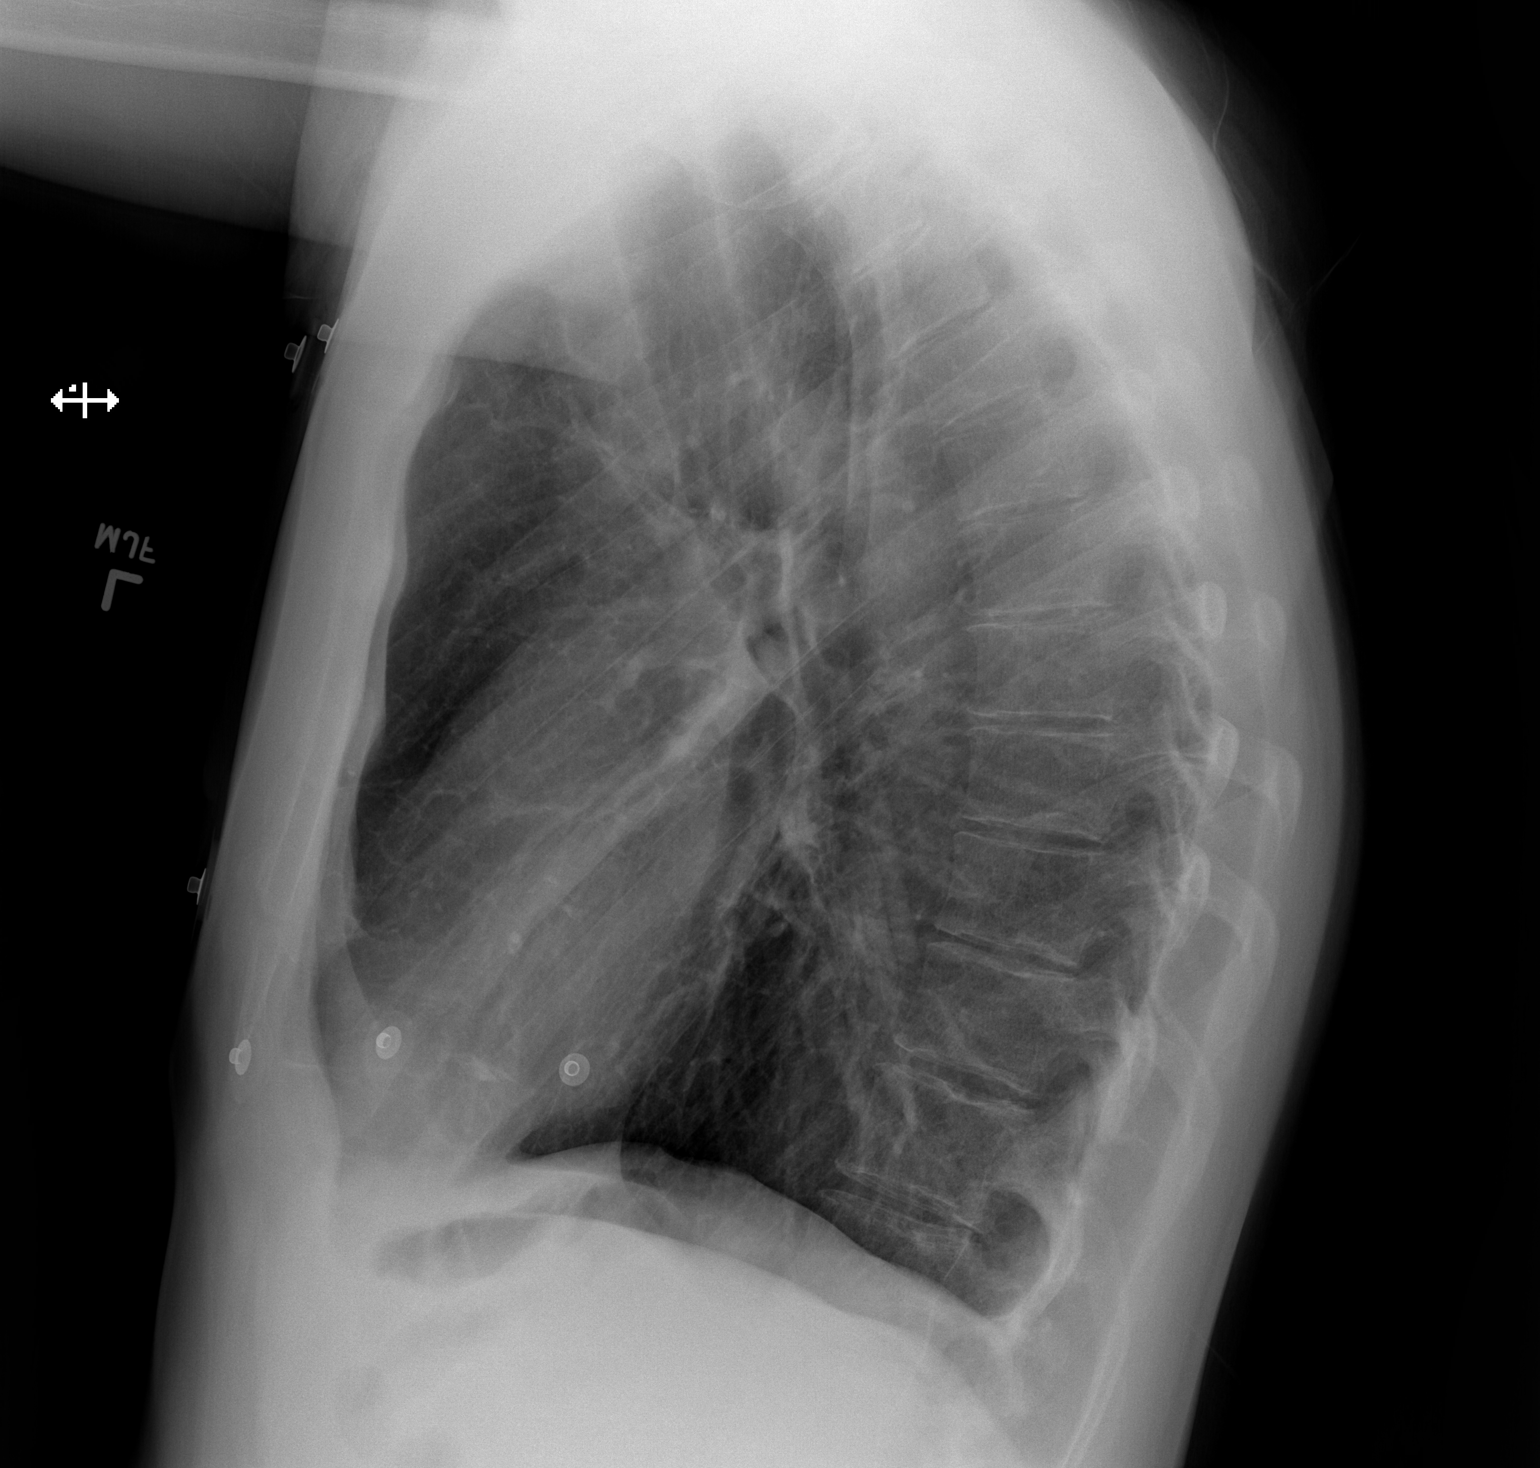

[2 of 2 positions shown; findings below may reference images not displayed]

FINDINGS: The heart size and mediastinal contours are within normal limits.
Both lungs are clear. The visualized skeletal structures are
unremarkable.
IMPRESSION: No active cardiopulmonary disease.

## 2021-03-14 IMAGING — CT CT HEAD W/O CM
3 series · 16 of 47 positions shown, 19 images · non-contrast
Comparison: None.

CLINICAL DATA: Headache

EXAM:
CT HEAD WITHOUT CONTRAST
TECHNIQUE: Contiguous axial images were obtained from the base of the skull
through the vertex without intravenous contrast.

[Series 3: head wo · axial · 0.45mm/px · z∈[-43,+87]mm · 10 of 32 slices shown, 13 images]
[im 3/32  brain]
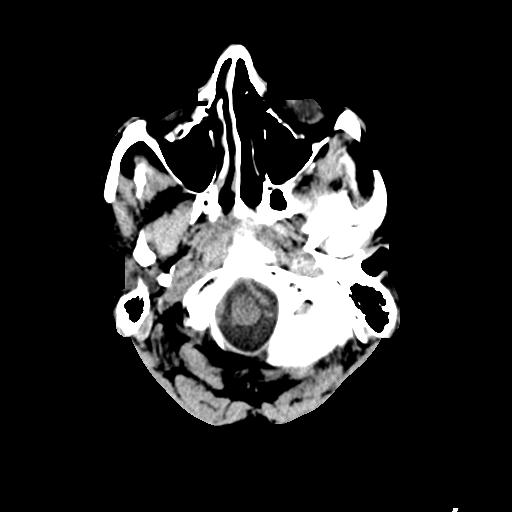
[im 3/32  bone]
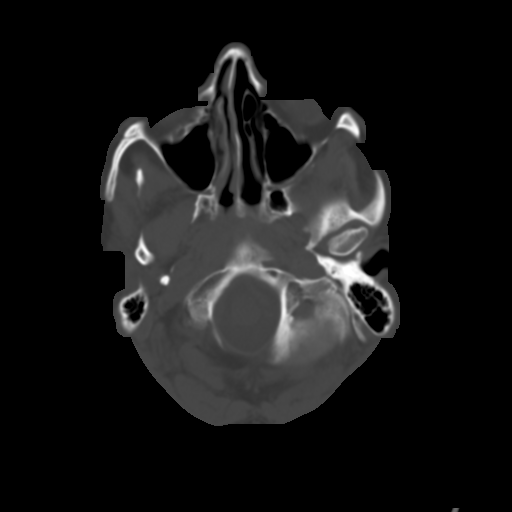
[im 6/32  brain]
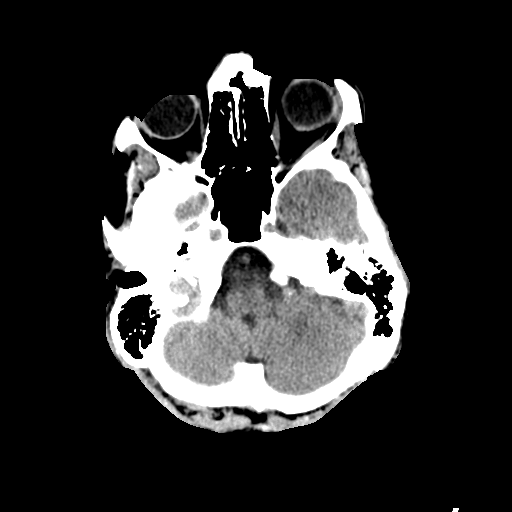
[im 9/32  brain]
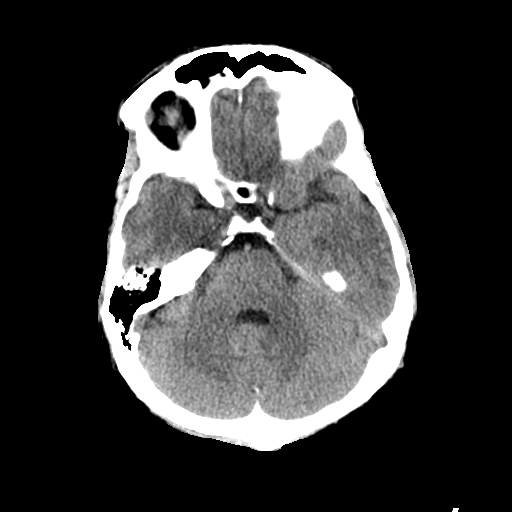
[im 11/32  brain]
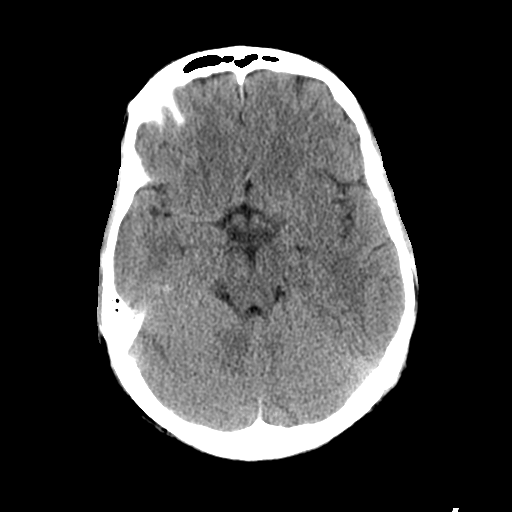
[im 14/32  brain]
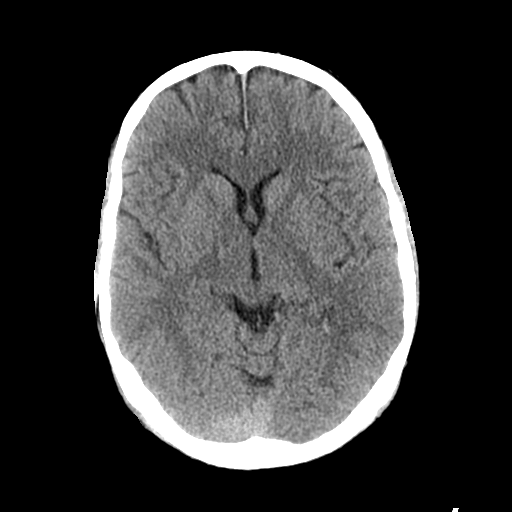
[im 14/32  bone]
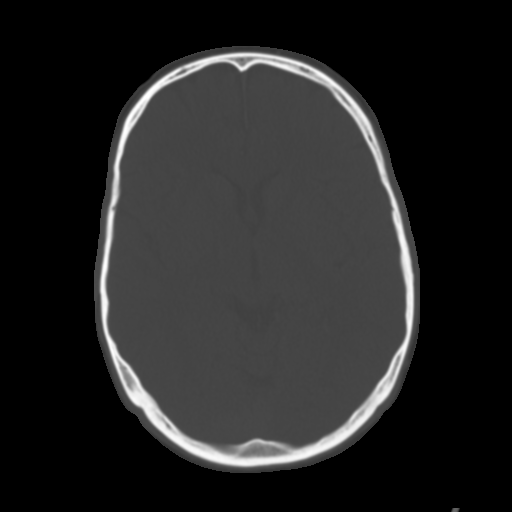
[im 18/32  brain]
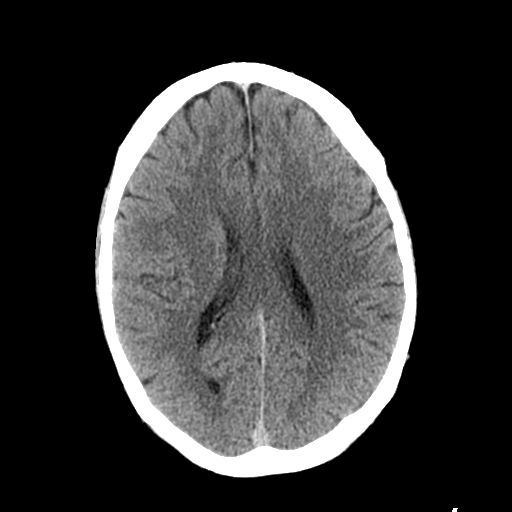
[im 21/32  brain]
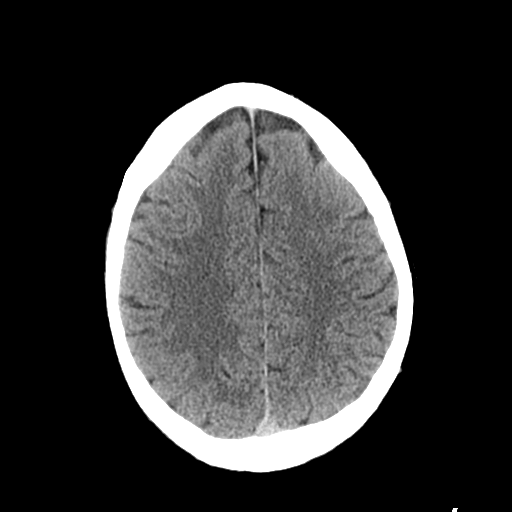
[im 24/32  brain]
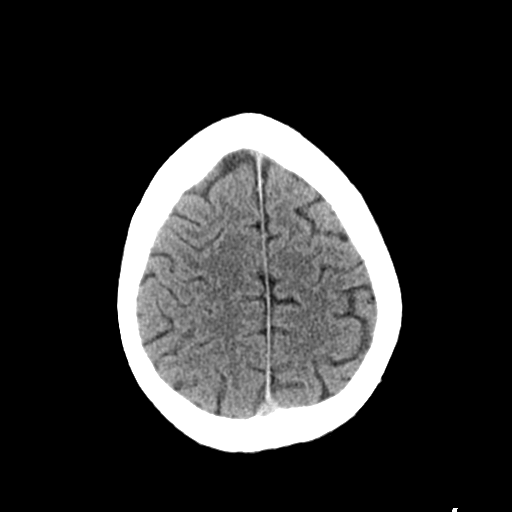
[im 26/32  brain]
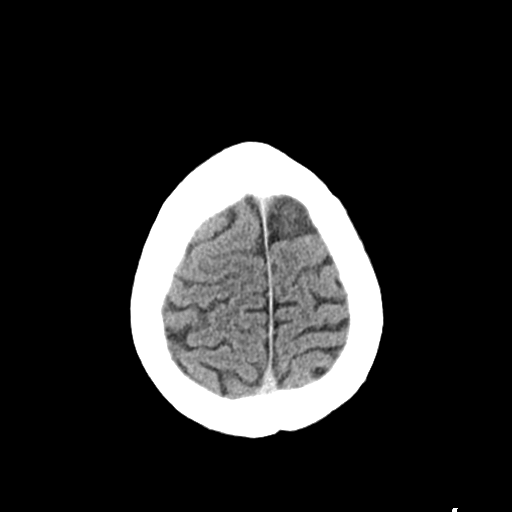
[im 26/32  bone]
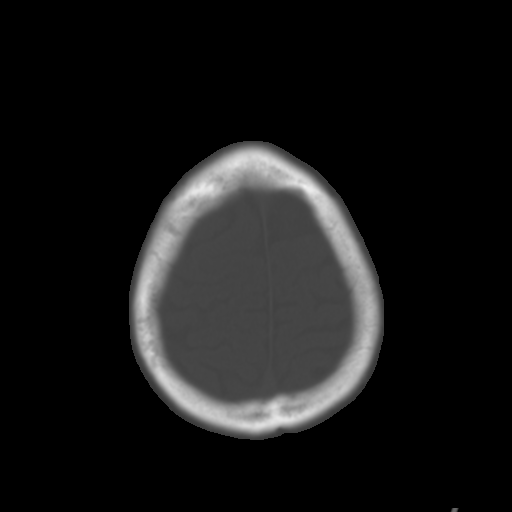
[im 29/32  brain]
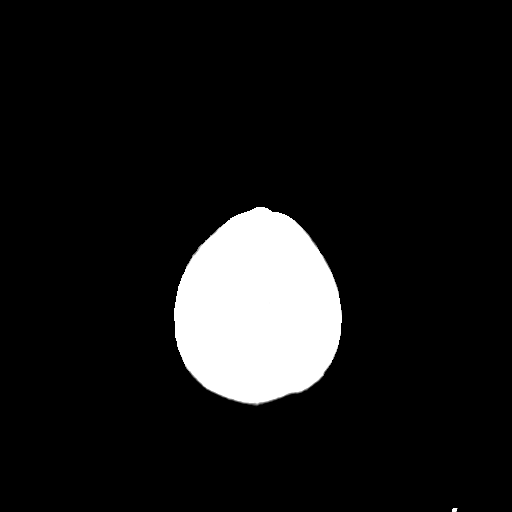

[Series 4: coronal soft tissue · coronal · 0.35mm/px · 3 of 68 slices shown]
[im 23/68  brain]
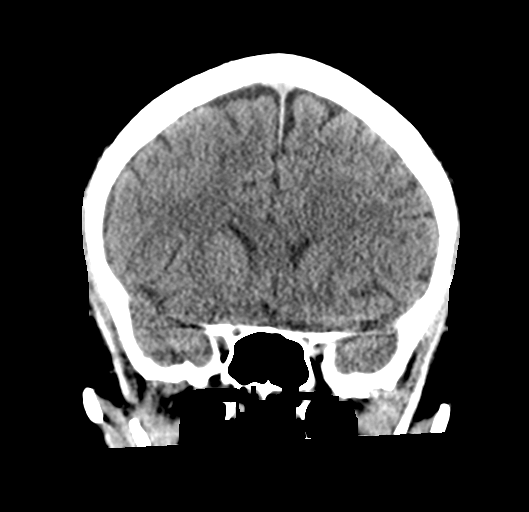
[im 30/68  brain]
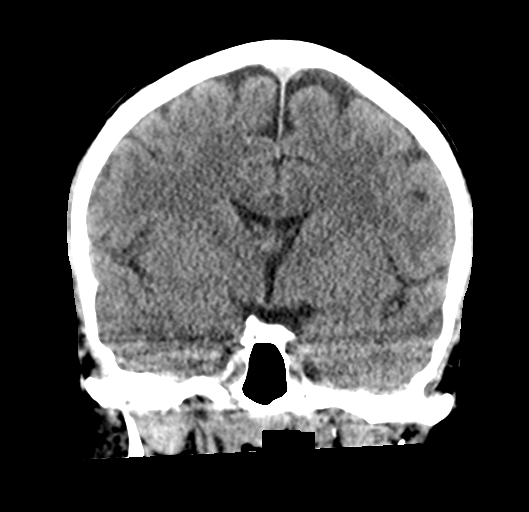
[im 38/68  brain]
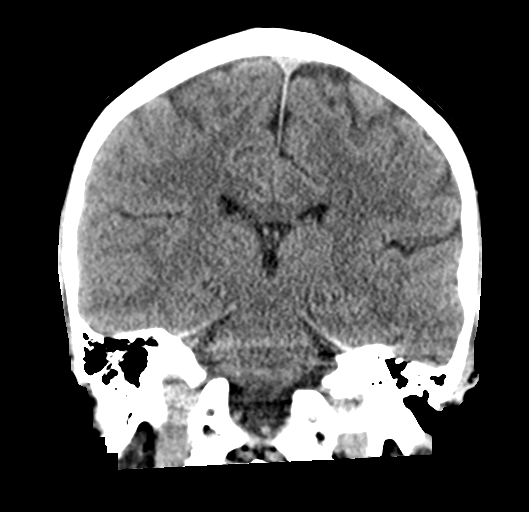

[Series 5: sagittal soft tissue · sagittal · 0.35mm/px · 3 of 54 slices shown]
[im 18/54  brain]
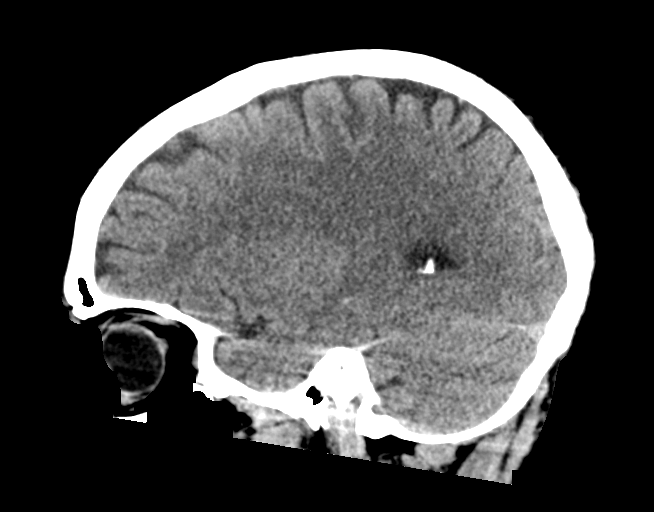
[im 27/54  brain]
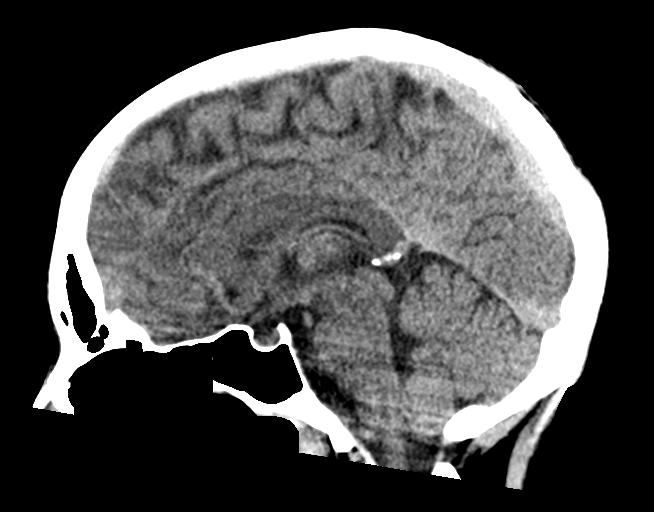
[im 36/54  brain]
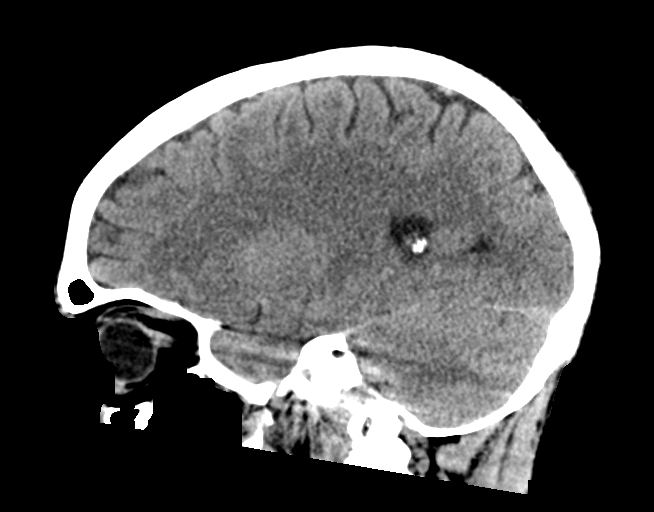

[16 of 47 positions shown; findings below may reference images not displayed]

FINDINGS: Brain: There is no acute intracranial hemorrhage, mass effect, or
edema. Gray-white differentiation is preserved. There is no
extra-axial fluid collection. Ventricles and sulci are within normal
limits in size and configuration.

Vascular: No hyperdense vessel or unexpected calcification.

Skull: Calvarium is unremarkable.

Sinuses/Orbits: Partially imaged small polyp or retention cyst of
the left maxillary sinus. Orbits are unremarkable.

Other: None.
IMPRESSION: No acute intracranial abnormality.

## 2021-03-14 MED ORDER — POTASSIUM CHLORIDE CRYS ER 20 MEQ PO TBCR
40.0000 meq | EXTENDED_RELEASE_TABLET | Freq: Once | ORAL | Status: AC
  Administered 2021-03-14: 40 meq via ORAL
  Filled 2021-03-14: qty 2

## 2021-03-14 MED ORDER — PROCHLORPERAZINE MALEATE 10 MG PO TABS
10.0000 mg | ORAL_TABLET | Freq: Once | ORAL | Status: AC
  Administered 2021-03-14: 10 mg via ORAL
  Filled 2021-03-14: qty 1

## 2021-03-14 MED ORDER — IBUPROFEN 400 MG PO TABS
400.0000 mg | ORAL_TABLET | Freq: Once | ORAL | Status: AC
  Administered 2021-03-14: 400 mg via ORAL
  Filled 2021-03-14: qty 1

## 2021-03-14 MED ORDER — PROCHLORPERAZINE EDISYLATE 10 MG/2ML IJ SOLN: 10.0000 mg | Freq: Once | INTRAMUSCULAR | Status: DC

## 2021-03-14 MED ORDER — DIPHENHYDRAMINE HCL 50 MG/ML IJ SOLN: 25.0000 mg | Freq: Once | INTRAMUSCULAR | Status: DC

## 2021-03-14 MED ORDER — PROCHLORPERAZINE MALEATE 10 MG PO TABS: 10.0000 mg | ORAL_TABLET | Freq: Four times a day (QID) | ORAL | 0 refills | Status: DC | PRN

## 2021-03-14 MED ORDER — SODIUM CHLORIDE 0.9 % IV BOLUS: 1000.0000 mL | Freq: Once | INTRAVENOUS | Status: DC

## 2021-03-14 NOTE — ED Notes (Signed)
Lab requested to recollect cbc at this time.
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
## 2021-03-14 NOTE — ED Triage Notes (Signed)
Pt comes into the ED via EMS from [place] [facility] with c/o SOB with chest pain, recently released from jail and thinks he is going through with withdrawals from methadone  127/86 99%RA temp 99.6 [id number]

## 2021-03-14 NOTE — ED Notes (Addendum)
Unable to successfully start IV on patient at this time. Multiple attempts by this RN and Colin Mulders RN unsuccessful. MD Larinda Buttery notified of difficulties.

## 2021-03-14 NOTE — ED Notes (Signed)
Patient states he is a former heroin user and requires [place] to start IV and draw blood.  Phlebotomy called to draw blood.

## 2021-03-14 NOTE — ED Notes (Signed)
Phlebotomy at bedside.

## 2021-03-14 NOTE — ED Notes (Signed)
Pt in xray

## 2021-03-14 NOTE — ED Notes (Signed)
Pt provided with warm blankets.

## 2021-03-14 NOTE — Progress Notes (Signed)
Patient would prefer Rx's via oral route and no IV access at this time.  Dr. Larinda Buttery came to bedside; no PIV necessary at this time.  Primary RN, Katie aware to put in a new consult if plan changes.

## 2021-03-15 LAB — SARS CORONAVIRUS 2 (TAT 6-24 HRS): SARS Coronavirus 2: NEGATIVE

## 2021-05-23 ENCOUNTER — Other Ambulatory Visit: Payer: Self-pay

## 2021-05-23 ENCOUNTER — Emergency Department
Admission: EM | Admit: 2021-05-23 | Discharge: 2021-05-23 | Disposition: A | Discharge status: Home / Self Care | Payer: No Typology Code available for payment source | Attending: Emergency Medicine | Admitting: Emergency Medicine

## 2021-05-23 DIAGNOSIS — Y900 Blood alcohol level of less than 20 mg/100 ml: Secondary | ICD-10-CM | POA: Insufficient documentation

## 2021-05-23 DIAGNOSIS — F17210 Nicotine dependence, cigarettes, uncomplicated: Secondary | ICD-10-CM | POA: Insufficient documentation

## 2021-05-23 DIAGNOSIS — F1120 Opioid dependence, uncomplicated: Secondary | ICD-10-CM | POA: Diagnosis not present

## 2021-05-23 DIAGNOSIS — F1590 Other stimulant use, unspecified, uncomplicated: Secondary | ICD-10-CM | POA: Diagnosis present

## 2021-05-23 LAB — CBC
HCT: 38.3 % — ABNORMAL LOW (ref 39.0–52.0)
Hemoglobin: 13.2 g/dL (ref 13.0–17.0)
MCH: 30.1 pg (ref 26.0–34.0)
MCHC: 34.5 g/dL (ref 30.0–36.0)
MCV: 87.2 fL (ref 80.0–100.0)
Platelets: 277 10*3/uL (ref 150–400)
RBC: 4.39 MIL/uL (ref 4.22–5.81)
RDW: 12.7 % (ref 11.5–15.5)
WBC: 6.5 10*3/uL (ref 4.0–10.5)
nRBC: 0 % (ref 0.0–0.2)

## 2021-05-23 LAB — COMPREHENSIVE METABOLIC PANEL
ALT: 11 U/L (ref 0–44)
AST: 18 U/L (ref 15–41)
Albumin: 4.2 g/dL (ref 3.5–5.0)
Alkaline Phosphatase: 81 U/L (ref 38–126)
Anion gap: 7 (ref 5–15)
BUN: 12 mg/dL (ref 6–20)
CO2: 28 mmol/L (ref 22–32)
Calcium: 9.5 mg/dL (ref 8.9–10.3)
Chloride: 103 mmol/L (ref 98–111)
Creatinine, Ser: 0.97 mg/dL (ref 0.61–1.24)
GFR, Estimated: >60 mL/min (ref >60)
Glucose, Bld: 120 mg/dL — ABNORMAL HIGH (ref 70–99)
Potassium: 3.5 mmol/L (ref 3.5–5.1)
Sodium: 138 mmol/L (ref 135–145)
Total Bilirubin: 0.6 mg/dL (ref 0.3–1.2)
Total Protein: 7.8 g/dL (ref 6.5–8.1)

## 2021-05-23 LAB — SALICYLATE LEVEL: Salicylate Lvl: <7 mg/dL — ABNORMAL LOW (ref 7.0–30.0)

## 2021-05-23 LAB — ETHANOL: Alcohol, Ethyl (B): <10 mg/dL (ref <10)

## 2021-05-23 LAB — ACETAMINOPHEN LEVEL: Acetaminophen (Tylenol), Serum: 13 ug/mL (ref 10–30)

## 2021-05-23 NOTE — ED Notes (Addendum)
Pt ambulatory to bathroom at this time. States he will attempt to provide a urine sample. States he spoke with detox facility on the phone and was told to have his papers faxed over to them. Md Lawyer and secetary Harriett Rush notified.

## 2021-05-23 NOTE — Consult Note (Signed)
Previous curbside received from emergency room physician.  Chart reviewed.  Recommendation is referral to residential treatment services for detox.  I spoke with their intake office.  They need to have information including drug screen sent by fax.  Drug screen not yet completed.  Once that is done we can copy information and send it to them.

## 2021-05-23 NOTE — ED Provider Notes (Signed)
[facility] Emergency Department Provider Note   ____________________________________________    I have reviewed the triage vital signs and the nursing notes.   HISTORY  Chief Complaint detox     HPI Richard Logan is a 35 y.o. male with request for detox.  Patient reports he injects fentanyl, he also reports that he gets daily Subutex from the clinic.  Additionally he reports that he uses methamphetamines because he has been told that that prevents withdrawal symptoms from fentanyl.  He is here because he wants to go to residential treatment service and was told that he needs labs done at the emergency department first.  He has no physical complaints at this time.  Reports using this morning.  Past Medical History:  Diagnosis Date   C. difficile diarrhea    Hepatitis C    Kidney stones    [facility] spotted fever     Patient Active Problem List   Diagnosis Date Noted   Lactic acidosis 12/19/2012   Multiple drug overdose 12/19/2012   Acute respiratory failure with hypoxia (HCC) 12/19/2012    Past Surgical History:  Procedure Laterality Date   TONSILLECTOMY      Prior to Admission medications   Medication Sig Start Date End Date Taking? Authorizing Provider  erythromycin ophthalmic ointment Place 1 application into the left eye 4 (four) times daily. 07/22/19   Enid Derry, PA-C  prochlorperazine (COMPAZINE) 10 MG tablet Take 1 tablet (10 mg total) by mouth every 6 (six) hours as needed for nausea or vomiting. 03/14/21   Chesley Noon, MD     Allergies Penicillins and Tylenol [acetaminophen]  No family history on file.  Social History Social History   Tobacco Use   Smoking status: Every Day    Packs/day: 1.00    Types: Cigarettes   Smokeless tobacco: Never  Substance Use Topics   Alcohol use: Yes    Comment: occassional   Drug use: Yes    Frequency: 1.0 times per week    Types: Marijuana, Methamphetamines    Comment:  last used heroin 4 months ago    Review of Systems  Constitutional: No fever/chills Eyes: No visual changes.  ENT: No sore throat. Cardiovascular: Denies chest pain. Respiratory: Denies shortness of breath. Gastrointestinal: No abdominal pain.  No nausea, no vomiting.   Genitourinary: Negative for dysuria. Musculoskeletal: Negative for back pain. Skin: Negative for rash. Neurological: Negative for headaches    ____________________________________________   PHYSICAL EXAM:  VITAL SIGNS: ED Triage Vitals  Enc Vitals Group     BP 05/23/21 0922 132/90     Pulse Rate 05/23/21 0922 (!) 136     Resp 05/23/21 0922 18     Temp 05/23/21 0922 98.4 F (36.9 C)     Temp Source 05/23/21 0922 Oral     SpO2 05/23/21 0922 96 %     Weight 05/23/21 0923 81.6 kg (180 lb)     Height 05/23/21 0923 1.803 m (5\' 11" )     Head Circumference --      Peak Flow --      Pain Score 05/23/21 0923 3     Pain Loc --      Pain Edu? --      Excl. in GC? --     Constitutional: Alert and oriented. No acute distress. Pleasant and interactive  Nose: No congestion/rhinnorhea. Mouth/Throat: Mucous membranes are moist.   Neck:  Painless ROM Cardiovascular: Normal rate, regular rhythm. Good peripheral circulation.  Respiratory: Normal respiratory effort.  No retractions.   Genitourinary: deferred Musculoskeletal: No lower extremity tenderness nor edema.  Warm and well perfused Neurologic:  Normal speech and language. No gross focal neurologic deficits are appreciated.  Skin:  Skin is warm, dry and intact. No rash noted. Psychiatric: Mood and affect are normal. Speech and behavior are normal.  ____________________________________________   LABS (all labs ordered are listed, but only abnormal results are displayed)  Labs Reviewed  COMPREHENSIVE METABOLIC PANEL - Abnormal; Notable for the following components:      Result Value   Glucose, Bld 120 (*)    All other components within normal limits   SALICYLATE LEVEL - Abnormal; Notable for the following components:   Salicylate Lvl <7.0 (*)    All other components within normal limits  CBC - Abnormal; Notable for the following components:   HCT 38.3 (*)    All other components within normal limits  ETHANOL  ACETAMINOPHEN LEVEL  URINE DRUG SCREEN, QUALITATIVE ([facility] ONLY)   ____________________________________________  EKG  ED ECG REPORT I, Jene Every, the attending physician, personally viewed and interpreted this ECG.  Date: 05/23/2021  Rhythm: normal sinus rhythm QRS Axis: normal Intervals: normal ST/T Wave abnormalities: normal Narrative Interpretation: no evidence of acute ischemia  ____________________________________________  RADIOLOGY  None ____________________________________________   PROCEDURES  Procedure(s) performed: No  Procedures   Critical Care performed: No ____________________________________________   INITIAL IMPRESSION / ASSESSMENT AND PLAN / ED COURSE  Pertinent labs & imaging results that were available during my care of the patient were reviewed by me and considered in my medical decision making (see chart for details).   Patient well-appearing and in no acute distress, here for opioid detox.  Labs drawn however patient is having difficulty providing urine drug screen  Dr. Toni Amend has contacted [facility] and they have noted that he will be unable to go to [facility] because of difficulty urinating and Should follow-up with [facility].  No further ED workup necessary at this time.    ____________________________________________   FINAL CLINICAL IMPRESSION(S) / ED DIAGNOSES  Final diagnoses:  Opioid dependence with current use [facility])        Note:  This document was prepared using Dragon voice recognition software and may include unintentional dictation errors.    Jene Every, MD 05/23/21 1459

## 2021-05-23 NOTE — ED Triage Notes (Signed)
Pt to ED POV for detox from fentanyl and meth. Last used last night.  States insurance stated he needed to go to ER before treatment

## 2021-05-23 NOTE — ED Notes (Signed)
This RN checked on pt in bathroom, pt states they are attempting to give urine sample still. Pt in stable condition when they walked to restroom. Pt speaking with a clear voice when answering questions.

## 2021-05-23 NOTE — ED Notes (Signed)
Checked on pt in the bathroom. Pt standing at the toilet stating "I am trying to pee." Pt has been in the bathroom for a long time now. Pt informed that if he would go lay back down in his bed that I would scan his bladder to see how much urine he has in his bladder. Pt agreed to this. When I scanned pt bladder. There was only of urine in pt bladder. Pt stated, "Can you just put a catheter in me?" Pt informed that he did not have enough urine in his bladder for the MD to even order a catheter. Pt stated, "It always takes me several hours to pee at home. I just want to go to [facility]. The doctor said that he was going to send me." Pt has been made aware several times that [facility] did not accept him and that he could go to [facility] on his own. Pt hesitant on going to [facility]. Heather, RN is aware of results of bladder scan.

## 2021-05-23 NOTE — ED Notes (Signed)
Checked on pt in restroom, pt still in restroom. States that he is "trying to pee".

## 2021-05-23 NOTE — ED Notes (Signed)
This RN checked on this pt in the restroom, pt states they are "pooping and trying to pee". Pt in stable condition when they walked to restroom and answered all questions correctly when this RN asked for verification of name, DOB, and today's date.

## 2021-05-23 NOTE — ED Notes (Signed)
Pt walked to bathroom independently and in a stable manner at this time to attempt to give a urine sample.

## 2021-05-23 NOTE — ED Notes (Signed)
Pt unable to give urine sample, states his kidneys don't work and he can't give sample at this time. Pt walked independently back to bed.

## 2022-11-18 DIAGNOSIS — S3993XA Unspecified injury of pelvis, initial encounter: Secondary | ICD-10-CM | POA: Diagnosis not present

## 2022-11-18 DIAGNOSIS — F909 Attention-deficit hyperactivity disorder, unspecified type: Secondary | ICD-10-CM | POA: Diagnosis not present

## 2022-11-18 DIAGNOSIS — Y92410 Unspecified street and highway as the place of occurrence of the external cause: Secondary | ICD-10-CM | POA: Diagnosis not present

## 2022-11-18 DIAGNOSIS — R451 Restlessness and agitation: Secondary | ICD-10-CM | POA: Diagnosis not present

## 2022-11-18 DIAGNOSIS — F319 Bipolar disorder, unspecified: Secondary | ICD-10-CM | POA: Diagnosis not present

## 2022-11-18 DIAGNOSIS — R0689 Other abnormalities of breathing: Secondary | ICD-10-CM | POA: Diagnosis not present

## 2022-11-18 DIAGNOSIS — Z041 Encounter for examination and observation following transport accident: Secondary | ICD-10-CM | POA: Diagnosis not present

## 2022-11-18 DIAGNOSIS — D696 Thrombocytopenia, unspecified: Secondary | ICD-10-CM | POA: Diagnosis not present

## 2022-11-18 DIAGNOSIS — F4010 Social phobia, unspecified: Secondary | ICD-10-CM | POA: Diagnosis not present

## 2022-11-18 DIAGNOSIS — S199XXA Unspecified injury of neck, initial encounter: Secondary | ICD-10-CM | POA: Diagnosis not present

## 2022-11-18 DIAGNOSIS — E8729 Other acidosis: Secondary | ICD-10-CM | POA: Diagnosis not present

## 2022-11-18 DIAGNOSIS — R4781 Slurred speech: Secondary | ICD-10-CM | POA: Diagnosis not present

## 2022-11-18 DIAGNOSIS — S0990XA Unspecified injury of head, initial encounter: Secondary | ICD-10-CM | POA: Diagnosis not present

## 2022-11-18 DIAGNOSIS — S3992XA Unspecified injury of lower back, initial encounter: Secondary | ICD-10-CM | POA: Diagnosis not present

## 2022-11-18 DIAGNOSIS — S299XXA Unspecified injury of thorax, initial encounter: Secondary | ICD-10-CM | POA: Diagnosis not present

## 2022-11-18 DIAGNOSIS — R45851 Suicidal ideations: Secondary | ICD-10-CM | POA: Diagnosis not present

## 2022-11-18 DIAGNOSIS — Z20822 Contact with and (suspected) exposure to covid-19: Secondary | ICD-10-CM | POA: Diagnosis not present

## 2022-11-18 DIAGNOSIS — F10129 Alcohol abuse with intoxication, unspecified: Secondary | ICD-10-CM | POA: Diagnosis not present

## 2023-03-13 ENCOUNTER — Other Ambulatory Visit: Payer: Self-pay

## 2023-03-13 ENCOUNTER — Emergency Department
Admission: EM | Admit: 2023-03-13 | Discharge: 2023-03-13 | Disposition: A | Discharge status: Home / Self Care | Payer: Medicaid Other | Attending: Emergency Medicine | Admitting: Emergency Medicine

## 2023-03-13 DIAGNOSIS — R42 Dizziness and giddiness: Secondary | ICD-10-CM

## 2023-03-13 DIAGNOSIS — E860 Dehydration: Secondary | ICD-10-CM | POA: Insufficient documentation

## 2023-03-13 MED ORDER — SODIUM CHLORIDE 0.9 % IV BOLUS
1000.0000 mL | Freq: Once | INTRAVENOUS | Status: AC
  Administered 2023-03-13: 1000 mL via INTRAVENOUS

## 2023-03-13 NOTE — ED Triage Notes (Signed)
Pt arrives to [facility]h via EMS from [facility] and was seen for "weakness" x3 days. Pt very drowsy, endorsing he works swing shift and denies drug or alcohol use.  Pt was given NS by EMS, states he is feeling much better and would like to sign out to go home. Pt is AO4.

## 2023-03-13 NOTE — ED Triage Notes (Signed)
Pt presents to the ED via [facility] from [facility] for hypotension. Initial BP with EMS was 70's systolic. After 500Ml's NaCl BP improved to 99/60. Pt has a 20G IV from EMS in left foot. Pt has a hx of IV drug use and has an allergy to narcan per pt he has an anaphylactic reaction to it. Pt states that he has been clean for 5+ years. Pt states that he took his prescribed xanax this morning and went to urgent care because the last week he has been seeing stars when he stands up. BP 100/63 at time of triage. Primary RN at bedside

## 2023-03-13 NOTE — ED Provider Notes (Signed)
[facility] Provider Note    Event Date/Time   First MD Initiated Contact with Patient 03/13/23 1157     (approximate)   History   Weakness   HPI  Richard Logan is a 37 y.o. male   Past medical history of substance use who presents to the emergency department with orthostatic lightheadedness and generalized weakness/fatigue over the last several days.  Very poor p.o. intake.  Denies drug use or alcohol use.  He has no pain or other medical complaints.  He was brought here by EMS from a clinic.  He got approximately 1 L of crystalloid en route and was initially hypotensive but now normotensive.  I spoke with the patient who denies any acute medical complaints and actually wants to leave now that he feels better.  He denies any drug use, denies any focal infectious symptoms, denies pain.  He understands that by leaving prior to medical evaluation he could have undiagnosed medical conditions like electrolyte abnormalities, kidney failure, other potential medical problems that could get worse leading to worsening condition, disability or death if undiagnosed but despite these risks that he understands he would like to leave at this time.       Physical Exam   Triage Vital Signs: ED Triage Vitals  Enc Vitals Group     BP      Pulse      Resp      Temp      Temp src      SpO2      Weight      Height      Head Circumference      Peak Flow      Pain Score      Pain Loc      Pain Edu?      Excl. in GC?     Most recent vital signs: Vitals:   03/13/23 1212 03/13/23 1213  BP:  100/62  Pulse:  (!) 59  Resp:  18  Temp:  97.7 F (36.5 C)  SpO2: 98% 99%    General: Awake, no distress.  CV:  Good peripheral perfusion.  Resp:  Normal effort.  Abd:  No distention.  Other:  Pale chronically ill-appearing normotensive soft nontender abdomen clear lungs.  Awake alert and oriented.  No signs of trauma.   ED Results / Procedures / Treatments    Labs (all labs ordered are listed, but only abnormal results are displayed) Labs Reviewed  COMPREHENSIVE METABOLIC PANEL  LIPASE, BLOOD  LACTIC ACID, PLASMA  LACTIC ACID, PLASMA  CBC WITH DIFFERENTIAL/PLATELET  URINALYSIS, W/ REFLEX TO CULTURE (INFECTION SUSPECTED)     PROCEDURES:  Critical Care performed: No  Procedures   MEDICATIONS ORDERED IN ED: Medications  sodium chloride 0.9 % bolus 1,000 mL (1,000 mLs Intravenous New Bag/Given 03/13/23 1214)    IMPRESSION / MDM / ASSESSMENT AND PLAN / ED COURSE  I reviewed the triage vital signs and the nursing notes.                                Patient's presentation is most consistent with acute presentation with potential threat to life or bodily function.  Differential diagnosis includes, but is not limited to, infection, sepsis, dehydration, electrolyte disturbance, kidney failure, substance use or intoxication   The patient is on the cardiac monitor to evaluate for evidence of arrhythmia and/or significant heart rate changes.  MDM:  This is a patient with broad differential diagnosis as above but is of sound mind has decision-making capacity and is electing to forego further medical evaluation and treatment today after receiving IV crystalloids from EMS he feels markedly better.  I see no reason to hold him against as well and he understands risks of leaving with possible undiagnosed condition leading to worsening medical condition disability or death and accepts these risks.  He will follow-up with his primary doctor. Or stands to return with any new or worsening symptoms.      FINAL CLINICAL IMPRESSION(S) / ED DIAGNOSES   Final diagnoses:  Orthostatic lightheadedness  Dehydration     Rx / DC Orders   ED Discharge Orders     None        Note:  This document was prepared using Dragon voice recognition software and may include unintentional dictation errors.    Pilar Jarvis, MD 03/13/23 [phone]

## 2023-03-13 NOTE — Discharge Instructions (Signed)
Drink plenty of fluids to stay well-hydrated.  You decided to not get blood testing and further evaluation in the emergency department because you felt better.  You understood that there could be possible undiagnosed conditions that can lead to worsening health disability or death but despite these risks you decided not to get further evaluation or treatment in the emergency department today.   It  Is important that you follow-up with your doctor this week for checkup.  If you feel worse in any sort away or change your mind come back to the emergency department for reevaluation.

## 2023-03-13 NOTE — ED Notes (Signed)
Pt endorsing he wants to sign out AMA, Dr Modesto Charon notified. AMA explained to pt, pt signed at this time. Pt is alert, oriented, ambulatory. Pt ambulatory to ED exit with even steady gait, endorses his wife is on the way to pick him up.

## 2023-07-01 ENCOUNTER — Emergency Department
Admission: EM | Admit: 2023-07-01 | Discharge: 2023-07-01 | Disposition: A | Discharge status: Home / Self Care | Payer: Medicaid Other | Attending: Emergency Medicine | Admitting: Emergency Medicine

## 2023-07-01 ENCOUNTER — Other Ambulatory Visit: Payer: Self-pay

## 2023-07-01 DIAGNOSIS — F1910 Other psychoactive substance abuse, uncomplicated: Secondary | ICD-10-CM | POA: Insufficient documentation

## 2023-07-01 NOTE — Group Note (Deleted)
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
## 2023-07-01 NOTE — Discharge Instructions (Signed)
Please go to the outpatient behavioral health urgent care located at [address] [place], [place] [address].  This center can be very helpful in arranging for treatment for substance abuse and resources that I believe would be beneficial for you.  Do not currently require admission to the hospital, but I believe you would benefit from going to the behavioral health urgent care also known as [facility]  Please return to the ER by calling 911 right away if you develop severe shaking, fever, headache, nausea vomiting, have a seizure, or other signs or symptoms or concerns arise.
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
## 2023-08-08 ENCOUNTER — Encounter: Payer: Self-pay | Admitting: Emergency Medicine

## 2023-08-08 ENCOUNTER — Emergency Department: Payer: Medicaid Other

## 2023-08-08 ENCOUNTER — Other Ambulatory Visit: Payer: Self-pay

## 2023-08-08 ENCOUNTER — Emergency Department
Admission: EM | Admit: 2023-08-08 | Discharge: 2023-08-08 | Disposition: A | Discharge status: Home / Self Care | Payer: Medicaid Other | Attending: Emergency Medicine | Admitting: Emergency Medicine

## 2023-08-08 DIAGNOSIS — R319 Hematuria, unspecified: Secondary | ICD-10-CM | POA: Insufficient documentation

## 2023-08-08 DIAGNOSIS — R109 Unspecified abdominal pain: Secondary | ICD-10-CM | POA: Insufficient documentation

## 2023-08-08 LAB — URINALYSIS, W/ REFLEX TO CULTURE (INFECTION SUSPECTED)
Bilirubin Urine: NEGATIVE
Glucose, UA: NEGATIVE mg/dL
Ketones, ur: NEGATIVE mg/dL
Leukocytes,Ua: NEGATIVE
Nitrite: NEGATIVE
Protein, ur: NEGATIVE mg/dL
RBC / HPF: >50 RBC/hpf (ref 0–5)
Specific Gravity, Urine: 1.021 (ref 1.005–1.030)
pH: 5 (ref 5.0–8.0)

## 2023-08-08 LAB — CBC WITH DIFFERENTIAL/PLATELET
Abs Immature Granulocytes: 0.01 10*3/uL (ref 0.00–0.07)
Basophils Absolute: 0 10*3/uL (ref 0.0–0.1)
Basophils Relative: 0 %
Eosinophils Absolute: 0.1 10*3/uL (ref 0.0–0.5)
Eosinophils Relative: 3 %
HCT: 39.1 % (ref 39.0–52.0)
Hemoglobin: 12.9 g/dL — ABNORMAL LOW (ref 13.0–17.0)
Immature Granulocytes: 0 %
Lymphocytes Relative: 38 %
Lymphs Abs: 1.8 10*3/uL (ref 0.7–4.0)
MCH: 29.3 pg (ref 26.0–34.0)
MCHC: 33 g/dL (ref 30.0–36.0)
MCV: 88.7 fL (ref 80.0–100.0)
Monocytes Absolute: 0.3 10*3/uL (ref 0.1–1.0)
Monocytes Relative: 7 %
Neutro Abs: 2.5 10*3/uL (ref 1.7–7.7)
Neutrophils Relative %: 52 %
Platelets: 168 10*3/uL (ref 150–400)
RBC: 4.41 MIL/uL (ref 4.22–5.81)
RDW: 13.3 % (ref 11.5–15.5)
WBC: 4.8 10*3/uL (ref 4.0–10.5)
nRBC: 0 % (ref 0.0–0.2)

## 2023-08-08 LAB — BASIC METABOLIC PANEL
Anion gap: 6 (ref 5–15)
BUN: 16 mg/dL (ref 6–20)
CO2: 29 mmol/L (ref 22–32)
Calcium: 9 mg/dL (ref 8.9–10.3)
Chloride: 103 mmol/L (ref 98–111)
Creatinine, Ser: 0.76 mg/dL (ref 0.61–1.24)
GFR, Estimated: >60 mL/min (ref >60)
Glucose, Bld: 107 mg/dL — ABNORMAL HIGH (ref 70–99)
Potassium: 3.7 mmol/L (ref 3.5–5.1)
Sodium: 138 mmol/L (ref 135–145)

## 2023-08-08 MED ORDER — KETOROLAC TROMETHAMINE 15 MG/ML IJ SOLN
15.0000 mg | Freq: Once | INTRAMUSCULAR | Status: AC
  Administered 2023-08-08: 15 mg via INTRAMUSCULAR
  Filled 2023-08-08: qty 1

## 2023-08-08 NOTE — Discharge Instructions (Signed)
It is quite possible that you passed a kidney stone.  You have kidney stones remaining in your kidneys.  Your blood work is otherwise unremarkable.  Please return to the emergency department for any new, worsening, or change in symptoms or other concerns.  Please follow-up with urology.  It was a pleasure caring for you today.

## 2023-08-08 NOTE — ED Triage Notes (Signed)
Pt c/o R flank pain, difficulty urinating, and hematuria that began approximately three days ago.
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
## 2023-08-10 NOTE — Plan of Care (Signed)
[facility] Tonsillectomy/Adenoidectomy, Postoperative PEDS care plan entered in error.

## 2023-08-14 ENCOUNTER — Other Ambulatory Visit: Payer: Self-pay

## 2023-08-14 ENCOUNTER — Emergency Department
Admission: EM | Admit: 2023-08-14 | Discharge: 2023-08-14 | Disposition: A | Discharge status: Home / Self Care | Payer: Medicaid Other | Attending: Student in an Organized Health Care Education/Training Program | Admitting: Student in an Organized Health Care Education/Training Program

## 2023-08-14 DIAGNOSIS — R109 Unspecified abdominal pain: Secondary | ICD-10-CM | POA: Diagnosis present

## 2023-08-14 LAB — URINALYSIS, ROUTINE W REFLEX MICROSCOPIC
Bacteria, UA: NONE SEEN
Bilirubin Urine: NEGATIVE
Glucose, UA: NEGATIVE mg/dL
Ketones, ur: NEGATIVE mg/dL
Leukocytes,Ua: NEGATIVE
Nitrite: NEGATIVE
Protein, ur: NEGATIVE mg/dL
RBC / HPF: >50 RBC/hpf (ref 0–5)
Specific Gravity, Urine: 1.011 (ref 1.005–1.030)
pH: 6 (ref 5.0–8.0)

## 2023-08-14 LAB — CBC
HCT: 51 % (ref 39.0–52.0)
Hemoglobin: 17.3 g/dL — ABNORMAL HIGH (ref 13.0–17.0)
MCH: 29.5 pg (ref 26.0–34.0)
MCHC: 33.9 g/dL (ref 30.0–36.0)
MCV: 86.9 fL (ref 80.0–100.0)
Platelets: 226 10*3/uL (ref 150–400)
RBC: 5.87 MIL/uL — ABNORMAL HIGH (ref 4.22–5.81)
RDW: 13.1 % (ref 11.5–15.5)
WBC: 5.1 10*3/uL (ref 4.0–10.5)
nRBC: 0 % (ref 0.0–0.2)

## 2023-08-14 LAB — COMPREHENSIVE METABOLIC PANEL
ALT: 34 U/L (ref 0–44)
AST: 41 U/L (ref 15–41)
Albumin: 4.5 g/dL (ref 3.5–5.0)
Alkaline Phosphatase: 110 U/L (ref 38–126)
Anion gap: 10 (ref 5–15)
BUN: 15 mg/dL (ref 6–20)
CO2: 24 mmol/L (ref 22–32)
Calcium: 9.6 mg/dL (ref 8.9–10.3)
Chloride: 102 mmol/L (ref 98–111)
Creatinine, Ser: 0.9 mg/dL (ref 0.61–1.24)
GFR, Estimated: >60 mL/min (ref >60)
Glucose, Bld: 124 mg/dL — ABNORMAL HIGH (ref 70–99)
Potassium: 4.9 mmol/L (ref 3.5–5.1)
Sodium: 136 mmol/L (ref 135–145)
Total Bilirubin: 0.7 mg/dL (ref 0.3–1.2)
Total Protein: 8.9 g/dL — ABNORMAL HIGH (ref 6.5–8.1)

## 2023-08-14 MED ORDER — KETOROLAC TROMETHAMINE 30 MG/ML IJ SOLN
15.0000 mg | Freq: Once | INTRAMUSCULAR | Status: AC
  Administered 2023-08-14: 15 mg via INTRAVENOUS

## 2023-08-14 MED ORDER — KETOROLAC TROMETHAMINE 30 MG/ML IJ SOLN
30.0000 mg | Freq: Once | INTRAMUSCULAR | Status: DC
  Filled 2023-08-14: qty 1

## 2023-08-14 NOTE — ED Triage Notes (Signed)
Pt presents to ED with c/o of R  flank pain that started a few days, pt seen recently for same. NAD noted.

## 2023-08-14 NOTE — ED Provider Notes (Signed)
[facility] Provider Note    Event Date/Time   First MD Initiated Contact with Patient 08/14/23 0840     (approximate)   History   Flank Pain   HPI  Richard Logan is a 37 y.o. male history of kidney stones recently seen in the ER for right flank pain and hematuria presents to the ER for evaluation of recurrent flank pain and intermittent blood in his urine.  No significant change in symptoms since he was seen few days ago does not feel like he is getting any better.  Denies any fevers or chills.  No nausea or vomiting.  Denies any trauma.     Physical Exam   Triage Vital Signs: ED Triage Vitals  Encounter Vitals Group     BP 08/14/23 0835 126/71     Systolic BP Percentile --      Diastolic BP Percentile --      Pulse Rate 08/14/23 0835 (!) 102     Resp 08/14/23 0835 17     Temp --      Temp src --      SpO2 08/14/23 0835 96 %     Weight 08/14/23 0836 185 lb (83.9 kg)     Height 08/14/23 0836 5\' 11"  (1.803 m)     Head Circumference --      Peak Flow --      Pain Score 08/14/23 0836 9     Pain Loc --      Pain Education --      Exclude from Growth Chart --     Most recent vital signs: Vitals:   08/14/23 0930 08/14/23 0947  BP: 105/71   Pulse: 94   Resp: 16   Temp:  98.1 F (36.7 C)  SpO2: 94%      Constitutional: Alert  Eyes: Conjunctivae are normal.  Head: Atraumatic. Nose: No congestion/rhinnorhea. Mouth/Throat: Mucous membranes are moist.   Neck: Painless ROM.  Cardiovascular:   Good peripheral circulation. Respiratory: Normal respiratory effort.  No retractions.  Gastrointestinal: Soft and nontender.  Musculoskeletal:  no deformity Neurologic:  MAE spontaneously. No gross focal neurologic deficits are appreciated.  Skin:  Skin is warm, dry and intact. No rash noted. Psychiatric: Mood and affect are normal. Speech and behavior are normal.    ED Results / Procedures / Treatments   Labs (all labs ordered are listed,  but only abnormal results are displayed) Labs Reviewed  COMPREHENSIVE METABOLIC PANEL - Abnormal; Notable for the following components:      Result Value   Glucose, Bld 124 (*)    Total Protein 8.9 (*)    All other components within normal limits  CBC - Abnormal; Notable for the following components:   RBC 5.87 (*)    Hemoglobin 17.3 (*)    All other components within normal limits  URINALYSIS, ROUTINE W REFLEX MICROSCOPIC - Abnormal; Notable for the following components:   Color, Urine YELLOW (*)    APPearance CLEAR (*)    Hgb urine dipstick LARGE (*)    All other components within normal limits     EKG     RADIOLOGY    PROCEDURES:  Critical Care performed:   Procedures   MEDICATIONS ORDERED IN ED: Medications  ketorolac (TORADOL) 30 MG/ML injection 15 mg (15 mg Intravenous Given 08/14/23 0905)     IMPRESSION / MDM / ASSESSMENT AND PLAN / ED COURSE  I reviewed the triage vital signs and the nursing notes.  Differential diagnosis includes, but is not limited to, stone, musculoskeletal strain, colitis, AAA, appendicitis, cystitis, pyelonephritis  Patient presenting to the ER for evaluation of symptoms as described above.  Based on symptoms, risk factors and considered above differential, this presenting complaint could reflect a potentially life-threatening illness therefore the patient will be placed on continuous pulse oximetry and telemetry for monitoring.  Laboratory evaluation will be sent to evaluate for the above complaints.  Patient presenting with similar symptoms as to prior visit.  Based on his exam and presentation I do not feel that repeat imaging is clinically indicated.  Clinic have a higher suspicion for musculoskeletal strain as his urine without any hematuria blood work is reassuring.  Normal renal function.  No leukocytosis no fevers.  No sign of infection.  Patient given Toradol with improvement in symptoms.  Does appear  appropriate for outpatient follow-up.       FINAL CLINICAL IMPRESSION(S) / ED DIAGNOSES   Final diagnoses:  Flank pain     Rx / DC Orders   ED Discharge Orders     None        Note:  This document was prepared using Dragon voice recognition software and may include unintentional dictation errors.    Willy Eddy, MD 08/14/23 [phone]

## 2023-08-15 ENCOUNTER — Other Ambulatory Visit: Payer: Self-pay

## 2023-08-15 ENCOUNTER — Emergency Department: Payer: Medicaid Other

## 2023-08-15 ENCOUNTER — Emergency Department
Admission: EM | Admit: 2023-08-15 | Discharge: 2023-08-15 | Disposition: A | Discharge status: Home / Self Care | Payer: Medicaid Other | Attending: Emergency Medicine | Admitting: Emergency Medicine

## 2023-08-15 DIAGNOSIS — N390 Urinary tract infection, site not specified: Secondary | ICD-10-CM | POA: Diagnosis not present

## 2023-08-15 DIAGNOSIS — R319 Hematuria, unspecified: Secondary | ICD-10-CM | POA: Insufficient documentation

## 2023-08-15 DIAGNOSIS — M545 Low back pain, unspecified: Secondary | ICD-10-CM | POA: Diagnosis present

## 2023-08-15 LAB — URINALYSIS, ROUTINE W REFLEX MICROSCOPIC
Bacteria, UA: NONE SEEN
Bilirubin Urine: NEGATIVE
Glucose, UA: NEGATIVE mg/dL
Ketones, ur: NEGATIVE mg/dL
Leukocytes,Ua: NEGATIVE
Nitrite: NEGATIVE
Protein, ur: NEGATIVE mg/dL
RBC / HPF: >50 RBC/hpf (ref 0–5)
Specific Gravity, Urine: 1.012 (ref 1.005–1.030)
pH: 5 (ref 5.0–8.0)

## 2023-08-15 MED ORDER — SULFAMETHOXAZOLE-TRIMETHOPRIM 800-160 MG PO TABS: 1.0000 | ORAL_TABLET | Freq: Two times a day (BID) | ORAL | 0 refills | Status: AC

## 2023-08-15 MED ORDER — BUPRENORPHINE HCL 8 MG SL SUBL
8.0000 mg | SUBLINGUAL_TABLET | Freq: Every day | SUBLINGUAL | Status: DC
  Administered 2023-08-15: 8 mg via SUBLINGUAL
  Filled 2023-08-15: qty 1

## 2023-08-15 MED ORDER — ALPRAZOLAM 0.5 MG PO TABS
1.0000 mg | ORAL_TABLET | Freq: Once | ORAL | Status: AC
  Administered 2023-08-15: 1 mg via ORAL
  Filled 2023-08-15: qty 2

## 2023-08-15 NOTE — Discharge Instructions (Addendum)
Call make an appoint with Dr. Signa Kell who is on-call for urology.  An antibiotic was sent to the pharmacy for you to begin taking for the next 10 days.  Increase fluids to stay hydrated.  Continue with your regular medication.
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
## 2023-08-15 NOTE — ED Notes (Signed)
See triage note  Presents with cont's back pain  Has been seen couple of times for same  Denies any new injury
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
## 2023-08-16 LAB — URINE CULTURE
Culture: <10000 — AB
Special Requests: NORMAL
